# Patient Record
Sex: Female | Born: 1990 | Race: White | Hispanic: No | Marital: Single | Smoking: Never smoker
Health system: Southern US, Community
[De-identification: ages and names within clinical notes are randomized; demographics above are authoritative.]

## PROBLEM LIST (undated history)

## (undated) DIAGNOSIS — Z1509 Genetic susceptibility to other malignant neoplasm: Secondary | ICD-10-CM

## (undated) DIAGNOSIS — Z1501 Genetic susceptibility to malignant neoplasm of breast: Secondary | ICD-10-CM

## (undated) DIAGNOSIS — Z8481 Family history of carrier of genetic disease: Secondary | ICD-10-CM

## (undated) HISTORY — DX: Genetic susceptibility to other malignant neoplasm: Z15.09

## (undated) HISTORY — DX: Family history of carrier of genetic disease: Z84.81

## (undated) HISTORY — DX: Genetic susceptibility to malignant neoplasm of breast: Z15.01

## (undated) HISTORY — PX: OTHER SURGICAL HISTORY: SHX169

---
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
## 2012-07-21 ENCOUNTER — Ambulatory Visit: Payer: BC Managed Care – PPO | Admitting: Internal Medicine

## 2012-08-28 ENCOUNTER — Other Ambulatory Visit: Payer: BC Managed Care – PPO | Admitting: Lab

## 2012-08-28 ENCOUNTER — Ambulatory Visit (HOSPITAL_BASED_OUTPATIENT_CLINIC_OR_DEPARTMENT_OTHER): Payer: BC Managed Care – PPO | Admitting: Genetic Counselor

## 2012-08-28 DIAGNOSIS — Z803 Family history of malignant neoplasm of breast: Secondary | ICD-10-CM

## 2012-08-28 DIAGNOSIS — Z1501 Genetic susceptibility to malignant neoplasm of breast: Secondary | ICD-10-CM

## 2012-08-28 DIAGNOSIS — Z809 Family history of malignant neoplasm, unspecified: Secondary | ICD-10-CM

## 2012-08-28 DIAGNOSIS — IMO0002 Reserved for concepts with insufficient information to code with codable children: Secondary | ICD-10-CM

## 2012-08-31 NOTE — Progress Notes (Signed)
Dr. Fabian Sharp requested a cancer genetics consultation for Desiree Mcclure, a 22 y.o. female, due to a family history of cancer and a recently identified BRCA1 mutation found in her father, Syona Wroblewski [facility] ZO#[id number]).  Desiree Mcclure presents to clinic today to discuss the possibility of a hereditary predisposition to cancer, genetic testing, and to further clarify her future cancer risks, as well as potential cancer risk for family members. She was accompanied to clinic by her mother and older sister, Noelia Lenart [facility] MR# [id number]).   HISTORY OF PRESENT ILLNESS: Desiree Mcclure has no personal history of cancer and reports she is in good general health.  HORMONAL RISK FACTORS: Menarche was at age 77 First live birth at age NA  OCP use: approximately 0 years Ovaries intact: yes Hysterectomy: no Menopausal status: premenopausal HRT use: approximately 0 years Colonoscopy: no; not examined Mammogram: no; not examined  History   Social History   Marital Status: Single    Spouse Name: N/A    Number of Children: N/A   Years of Education: N/A   Social History Main Topics   Smoking status: Never Smoker    Smokeless tobacco: Not on file   Alcohol Use: No   Drug Use: No   Sexually Active: Not on file   Other Topics Concern   Not on file      FAMILY HISTORY:  During the visit, a 4-generation pedigree was obtained. Significant diagnoses include the following:  Family History  Problem Relation Age of Onset   BRCA 1/2 Father     BRCA1, [phone]; [facility]   Breast cancer Paternal Aunt 56   Breast cancer Maternal Grandmother 72    bilateral; 46 and 45 yo   Colon cancer Maternal Grandfather 38   BRCA 1/2 Paternal Grandfather    Breast cancer Paternal Aunt 87   Ms. Nolton's ancestry is of Caucasian descent. There is Jewish ancestry on her mother's side of the family (maternal GM). There is no known consanguinity.  GENETIC COUNSELING ASSESSMENT: Desiree Mcclure is a 23 y.o. female  with a family history of breast cancer and a BRCA1 familial mutation on the paternal side of the family called, BRCA1, [id number]. This is a known deleterious mutation, and Desiree Mcclure is at 50% risk for this mutation and the associated cancer risks. We, therefore, discussed and recommended the following at today's visit.   DISCUSSION: We reviewed the characteristics, features and inheritance patterns of hereditary cancer syndromes. We also discussed genetic testing, including the appropriate family members to test, the process of testing, insurance coverage and turn-around-time for results. We recommended Desiree Mcclure pursue genetic testing for the BRCA1 mutation mentioned above.   PLAN: [name] wished to pursue genetic testing and the blood sample will be sent to [facility] for analysis of the BRCA1 mutation called, BRCA1, [id number].  We discussed the implications of a positive and negative genetic test result. Results should be available within approximately 3 weeks time, at which point they will be disclosed by telephone to Desiree Mcclure, as will any additional recommendations warranted by these results. Desiree Mcclure will receive a summary of her genetic counseling visit and a copy of her results once available. This information will also be available in Epic.     Based on [name] family history, we recommended her paternal relatives, both men and women pursue genetic testing for this mutation. We also recommended [name]'s maternal grandmother, who was diagnosed with bilateral breast cancer and is Jewish, pursue genetic testing.  If this maternal grandmother does not wish to pursue testing, we would then recommend Desiree Mcclure's mother pursue testing. Desiree Mcclure knows that if we can be of any assistance in coordinating genetic counseling and/or testing we are happy to do so.   We encouraged [name] to remain in contact with cancer genetics annually so that we can continuously update the family history and  inform her of any changes in cancer genetics and testing that may be of benefit for this family. Ms.  Weist questions were answered to her satisfaction today. Our contact information was provided should additional questions or concerns arise. Thank you for the referral and allowing [place] to share in the care of your patient.   The patient was seen for a total of 60 minutes, greater than 50% of which was spent face-to-face counseling.  This patient was discussed with Dr. Darnelle Catalan and he agrees with the above.   EPIC CC: Dr. Darnelle Catalan Dr. Welton Flakes   _______________________________________________________________________ For Office Staff:  Number of people involved in session: 4 Was an Intern/ student involved with case: no

## 2012-09-15 DIAGNOSIS — Z803 Family history of malignant neoplasm of breast: Secondary | ICD-10-CM | POA: Insufficient documentation

## 2012-09-21 ENCOUNTER — Encounter: Payer: Self-pay | Admitting: Genetic Counselor

## 2015-07-19 ENCOUNTER — Telehealth: Payer: Self-pay | Admitting: Internal Medicine

## 2015-07-19 NOTE — Telephone Encounter (Signed)
Yes but not this Friday

## 2015-07-19 NOTE — Telephone Encounter (Signed)
Pt last seen 2012. Pt mom is requesting re-est appt on Friday afternoon. Can I create 30 min slot?

## 2015-07-20 NOTE — Telephone Encounter (Signed)
Pt has been sch for 08-11-15

## 2015-08-11 ENCOUNTER — Ambulatory Visit (INDEPENDENT_AMBULATORY_CARE_PROVIDER_SITE_OTHER): Payer: BLUE CROSS/BLUE SHIELD | Admitting: Internal Medicine

## 2015-08-11 ENCOUNTER — Encounter: Payer: Self-pay | Admitting: Internal Medicine

## 2015-08-11 VITALS — BP 104/68 | HR 90 | Temp 98.4°F | Resp 12 | Ht 64.0 in | Wt 128.7 lb

## 2015-08-11 DIAGNOSIS — Z1501 Genetic susceptibility to malignant neoplasm of breast: Secondary | ICD-10-CM

## 2015-08-11 DIAGNOSIS — Z1509 Genetic susceptibility to other malignant neoplasm: Secondary | ICD-10-CM

## 2015-08-11 DIAGNOSIS — Z1502 Genetic susceptibility to malignant neoplasm of ovary: Secondary | ICD-10-CM

## 2015-08-11 DIAGNOSIS — Z0000 Encounter for general adult medical examination without abnormal findings: Secondary | ICD-10-CM

## 2015-08-11 LAB — LIPID PANEL
Cholesterol: 196 mg/dL (ref 0–200)
HDL: 91.2 mg/dL (ref >39.00)
LDL Cholesterol: 94 mg/dL (ref 0–99)
NonHDL: 105.17
Total CHOL/HDL Ratio: 2
Triglycerides: 57 mg/dL (ref 0.0–149.0)
VLDL: 11.4 mg/dL (ref 0.0–40.0)

## 2015-08-11 LAB — CBC WITH DIFFERENTIAL/PLATELET
Basophils Absolute: 0 10*3/uL (ref 0.0–0.1)
Basophils Relative: 0.3 % (ref 0.0–3.0)
Eosinophils Absolute: 0 10*3/uL (ref 0.0–0.7)
Eosinophils Relative: 0.2 % (ref 0.0–5.0)
HCT: 42.7 % (ref 36.0–46.0)
Hemoglobin: 14.4 g/dL (ref 12.0–15.0)
Lymphocytes Relative: 19.5 % (ref 12.0–46.0)
Lymphs Abs: 2.7 10*3/uL (ref 0.7–4.0)
MCHC: 33.9 g/dL (ref 30.0–36.0)
MCV: 87 fl (ref 78.0–100.0)
Monocytes Absolute: 1 10*3/uL (ref 0.1–1.0)
Monocytes Relative: 7.1 % (ref 3.0–12.0)
Neutro Abs: 9.8 10*3/uL — ABNORMAL HIGH (ref 1.4–7.7)
Neutrophils Relative %: 72.4 % (ref 43.0–77.0)
Platelets: 356 10*3/uL (ref 150.0–400.0)
RBC: 4.9 Mil/uL (ref 3.87–5.11)
RDW: 13.1 % (ref 11.5–15.5)
WBC: 13.5 10*3/uL — ABNORMAL HIGH (ref 4.0–10.5)

## 2015-08-11 LAB — BASIC METABOLIC PANEL
BUN: 12 mg/dL (ref 6–23)
CO2: 28 mEq/L (ref 19–32)
Calcium: 9.5 mg/dL (ref 8.4–10.5)
Chloride: 103 mEq/L (ref 96–112)
Creatinine, Ser: 0.68 mg/dL (ref 0.40–1.20)
GFR: 113.02 mL/min (ref >60.00)
Glucose, Bld: 72 mg/dL (ref 70–99)
Potassium: 3.9 mEq/L (ref 3.5–5.1)
Sodium: 140 mEq/L (ref 135–145)

## 2015-08-11 LAB — HEPATIC FUNCTION PANEL
ALT: 8 U/L (ref 0–35)
AST: 13 U/L (ref 0–37)
Albumin: 4.7 g/dL (ref 3.5–5.2)
Alkaline Phosphatase: 18 U/L — ABNORMAL LOW (ref 39–117)
Bilirubin, Direct: 0.1 mg/dL (ref 0.0–0.3)
Total Bilirubin: 0.5 mg/dL (ref 0.2–1.2)
Total Protein: 7.3 g/dL (ref 6.0–8.3)

## 2015-08-11 LAB — TSH: TSH: 0.92 u[IU]/mL (ref 0.35–4.50)

## 2015-08-11 NOTE — Patient Instructions (Addendum)
Get [place] a copy  Of the genetic evaluation. To help with more information. Also sign release  Incase  We can get records from previous testing Will notify you  of labs when available.  To include cholesterol and thyroid. And blood sugars   Will arrange  Referral to gyne   You have low to little risk for  cervical cancer by hx . However advise   Gyne fu anyway because of the genetic risk hx .   Let us know if  Getting inability to exercise  For physical reasons   Get more sleep  8 hours better .     Health Maintenance, Female Adopting a healthy lifestyle and getting preventive care can go a long way to promote health and wellness. Talk with your health care provider about what schedule of regular examinations is right for you. This is a good chance for you to check in with your provider about disease prevention and staying healthy. In between checkups, there are plenty of things you can do on your own. Experts have done a lot of research about which lifestyle changes and preventive measures are most likely to keep you healthy. Ask your health care provider for more information. WEIGHT AND DIET  Eat a healthy diet  Be sure to include plenty of vegetables, fruits, low-fat dairy products, and lean protein.  Do not eat a lot of foods high in solid fats, added sugars, or salt.  Get regular exercise. This is one of the most important things you can do for your health.  Most adults should exercise for at least 150 minutes each week. The exercise should increase your heart rate and make you sweat (moderate-intensity exercise).  Most adults should also do strengthening exercises at least twice a week. This is in addition to the moderate-intensity exercise.  Maintain a healthy weight  Body mass index (BMI) is a measurement that can be used to identify possible weight problems. It estimates body fat based on height and weight. Your health care provider can help determine your BMI and help you achieve  or maintain a healthy weight.  For females 19 years of age and older:   A BMI below 18.5 is considered underweight.  A BMI of 18.5 to 24.9 is normal.  A BMI of 25 to 29.9 is considered overweight.  A BMI of 30 and above is considered obese.  Watch levels of cholesterol and blood lipids  You should start having your blood tested for lipids and cholesterol at 24 years of age, then have this test every 5 years.  You may need to have your cholesterol levels checked more often if:  Your lipid or cholesterol levels are high.  You are older than 24 years of age.  You are at high risk for heart disease.  CANCER SCREENING   Lung Cancer  Lung cancer screening is recommended for adults 9-37 years old who are at high risk for lung cancer because of a history of smoking.  A yearly low-dose CT scan of the lungs is recommended for people who:  Currently smoke.  Have quit within the past 15 years.  Have at least a 30-pack-year history of smoking. A pack year is smoking an average of one pack of cigarettes a day for 1 year.  Yearly screening should continue until it has been 15 years since you quit.  Yearly screening should stop if you develop a health problem that would prevent you from having lung cancer treatment.  Breast Cancer  Practice breast self-awareness. This means understanding how your breasts normally appear and feel.  It also means doing regular breast self-exams. Let your health care provider know about any changes, no matter how small.  If you are in your 20s or 30s, you should have a clinical breast exam (CBE) by a health care provider every 1-3 years as part of a regular health exam.  If you are 20 or older, have a CBE every year. Also consider having a breast X-ray (mammogram) every year.  If you have a family history of breast cancer, talk to your health care provider about genetic screening.  If you are at high risk for breast cancer, talk to your health  care provider about having an MRI and a mammogram every year.  Breast cancer gene (BRCA) assessment is recommended for women who have family members with BRCA-related cancers. BRCA-related cancers include:  Breast.  Ovarian.  Tubal.  Peritoneal cancers.  Results of the assessment will determine the need for genetic counseling and BRCA1 and BRCA2 testing. Cervical Cancer Your health care provider may recommend that you be screened regularly for cancer of the pelvic organs (ovaries, uterus, and vagina). This screening involves a pelvic examination, including checking for microscopic changes to the surface of your cervix (Pap test). You may be encouraged to have this screening done every 3 years, beginning at age 67.  For women ages 83-65, health care providers may recommend pelvic exams and Pap testing every 3 years, or they may recommend the Pap and pelvic exam, combined with testing for human papilloma virus (HPV), every 5 years. Some types of HPV increase your risk of cervical cancer. Testing for HPV may also be done on women of any age with unclear Pap test results.  Other health care providers may not recommend any screening for nonpregnant women who are considered low risk for pelvic cancer and who do not have symptoms. Ask your health care provider if a screening pelvic exam is right for you.  If you have had past treatment for cervical cancer or a condition that could lead to cancer, you need Pap tests and screening for cancer for at least 20 years after your treatment. If Pap tests have been discontinued, your risk factors (such as having a new sexual partner) need to be reassessed to determine if screening should resume. Some women have medical problems that increase the chance of getting cervical cancer. In these cases, your health care provider may recommend more frequent screening and Pap tests. Colorectal Cancer  This type of cancer can be detected and often prevented.  Routine  colorectal cancer screening usually begins at 24 years of age and continues through 24 years of age.  Your health care provider may recommend screening at an earlier age if you have risk factors for colon cancer.  Your health care provider may also recommend using home test kits to check for hidden blood in the stool.  A small camera at the end of a tube can be used to examine your colon directly (sigmoidoscopy or colonoscopy). This is done to check for the earliest forms of colorectal cancer.  Routine screening usually begins at age 71.  Direct examination of the colon should be repeated every 5-10 years through 24 years of age. However, you may need to be screened more often if early forms of precancerous polyps or small growths are found. Skin Cancer  Check your skin from head to toe regularly.  Tell your health care provider about any  new moles or changes in moles, especially if there is a change in a mole's shape or color.  Also tell your health care provider if you have a mole that is larger than the size of a pencil eraser.  Always use sunscreen. Apply sunscreen liberally and repeatedly throughout the day.  Protect yourself by wearing long sleeves, pants, a wide-brimmed hat, and sunglasses whenever you are outside. HEART DISEASE, DIABETES, AND HIGH BLOOD PRESSURE   High blood pressure causes heart disease and increases the risk of stroke. High blood pressure is more likely to develop in:  People who have blood pressure in the high end of the normal range (130-139/85-89 mm Hg).  People who are overweight or obese.  People who are African American.  If you are 43-31 years of age, have your blood pressure checked every 3-5 years. If you are 38 years of age or older, have your blood pressure checked every year. You should have your blood pressure measured twice--once when you are at a hospital or clinic, and once when you are not at a hospital or clinic. Record the average of the  two measurements. To check your blood pressure when you are not at a hospital or clinic, you can use:  An automated blood pressure machine at a pharmacy.  A home blood pressure monitor.  If you are between 81 years and 9 years old, ask your health care provider if you should take aspirin to prevent strokes.  Have regular diabetes screenings. This involves taking a blood sample to check your fasting blood sugar level.  If you are at a normal weight and have a low risk for diabetes, have this test once every three years after 24 years of age.  If you are overweight and have a high risk for diabetes, consider being tested at a younger age or more often. PREVENTING INFECTION  Hepatitis B  If you have a higher risk for hepatitis B, you should be screened for this virus. You are considered at high risk for hepatitis B if:  You were born in a country where hepatitis B is common. Ask your health care provider which countries are considered high risk.  Your parents were born in a high-risk country, and you have not been immunized against hepatitis B (hepatitis B vaccine).  You have HIV or AIDS.  You use needles to inject street drugs.  You live with someone who has hepatitis B.  You have had sex with someone who has hepatitis B.  You get hemodialysis treatment.  You take certain medicines for conditions, including cancer, organ transplantation, and autoimmune conditions. Hepatitis C  Blood testing is recommended for:  Everyone born from [birth year] through 1964.  Anyone with known risk factors for hepatitis C. Sexually transmitted infections (STIs)  You should be screened for sexually transmitted infections (STIs) including gonorrhea and chlamydia if:  You are sexually active and are younger than 24 years of age.  You are older than 23 years of age and your health care provider tells you that you are at risk for this type of infection.  Your sexual activity has changed since you were  last screened and you are at an increased risk for chlamydia or gonorrhea. Ask your health care provider if you are at risk.  If you do not have HIV, but are at risk, it may be recommended that you take a prescription medicine daily to prevent HIV infection. This is called pre-exposure prophylaxis (PrEP). You are considered at risk  if:  You are sexually active and do not regularly use condoms or know the HIV status of your partner(s).  You take drugs by injection.  You are sexually active with a partner who has HIV. Talk with your health care provider about whether you are at high risk of being infected with HIV. If you choose to begin PrEP, you should first be tested for HIV. You should then be tested every 3 months for as long as you are taking PrEP.  PREGNANCY   If you are premenopausal and you may become pregnant, ask your health care provider about preconception counseling.  If you may become pregnant, take 400 to 800 micrograms (mcg) of folic acid every day.  If you want to prevent pregnancy, talk to your health care provider about birth control (contraception). OSTEOPOROSIS AND MENOPAUSE   Osteoporosis is a disease in which the bones lose minerals and strength with aging. This can result in serious bone fractures. Your risk for osteoporosis can be identified using a bone density scan.  If you are 63 years of age or older, or if you are at risk for osteoporosis and fractures, ask your health care provider if you should be screened.  Ask your health care provider whether you should take a calcium or vitamin D supplement to lower your risk for osteoporosis.  Menopause may have certain physical symptoms and risks.  Hormone replacement therapy may reduce some of these symptoms and risks. Talk to your health care provider about whether hormone replacement therapy is right for you.  HOME CARE INSTRUCTIONS   Schedule regular health, dental, and eye exams.  Stay current with your  immunizations.   Do not use any tobacco products including cigarettes, chewing tobacco, or electronic cigarettes.  If you are pregnant, do not drink alcohol.  If you are breastfeeding, limit how much and how often you drink alcohol.  Limit alcohol intake to no more than 1 drink per day for nonpregnant women. One drink equals 12 ounces of beer, 5 ounces of wine, or 1 ounces of hard liquor.  Do not use street drugs.  Do not share needles.  Ask your health care provider for help if you need support or information about quitting drugs.  Tell your health care provider if you often feel depressed.  Tell your health care provider if you have ever been abused or do not feel safe at home.   This information is not intended to replace advice given to you by your health care provider. Make sure you discuss any questions you have with your health care provider.   Document Released: 11/12/2010 Document Revised: 05/20/2014 Document Reviewed: 03/31/2013 Elsevier Interactive Patient Education [employer].

## 2015-08-11 NOTE — Progress Notes (Signed)
Chief Complaint  Patient presents with  . Establish Care    wellness exam  advice on  brca gene abnormality     HPI: Patient  Desiree Mcclure  25 y.o. comes in today for reestablish  New patient visit  Last seen in 2012  Need check up and   Advice  Breast cancer gene   Done 3-4 years ago . 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  Periods reg   Health Maintenance  Topic Date Due  . HIV Screening  08/23/2006  . TETANUS/TDAP  08/23/2010  . PAP SMEAR  08/22/2012  . INFLUENZA VACCINE  12/12/2015   Health Maintenance Review LIFESTYLE:  Exercise:  n Tobacco/ETS:n Alcohol: per day social Sugar beverages: Sleep:  Ok  Drug use: no lmp 2 weeks ago  Never had a pap  No sa    ROS:  Had heart beat strong when laying on stomach using computer  Had mintess of episode with chest pressure at work sitting nl high bp and   noass [place]  Drinks lots of caffiene GEN/ HEENT: No fever, significant weight changes sweats headaches vision problems hearing changes, CV/ PULM; No chest pain shortness of breath cough, syncope,edema  change in exercise tolerance. GI /GU: No adominal pain, vomiting, change in bowel habits. No blood in the stool. No significant GU symptoms. SKIN/HEME: ,no acute skin rashes suspicious lesions or bleeding. No lymphadenopathy, nodules, masses.  NEURO/ PSYCH:  No neurologic signs such as weakness numbness. No depression anxiety. IMM/ Allergy: No unusual infections.  Allergy .   REST of 12 system review negative except as per HPI   Past Medical History  Diagnosis Date  . Constipation   . 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 Family history of BRCA1 gene positive     father     Past Surgical History  Procedure Laterality Date  . Mole removal of toe      Family History  Problem Relation Age of Onset  . BRCA 1/2 Father     BRCA1, [id number]; [facility]  . Breast cancer Paternal Aunt 40   . Breast cancer Maternal Grandmother 68    bilateral; 78 and 65 yo  . Colon cancer Maternal Grandfather 33  . BRCA 1/2 Paternal Grandfather   . Breast cancer Paternal Aunt 31    Social History   Social History  . Marital Status: Single    Spouse Name: N/A  . Number of Children: N/A  . Years of Education: N/A   Social History Main Topics  . Smoking status: Never Smoker   . Smokeless tobacco: None  . Alcohol Use: No  . Drug Use: No  . Sexual Activity: Not Asked   Other Topics Concern  . 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   No facility-administered medications prior to visit.     EXAM:  BP 104/68 mmHg  Pulse 90  Temp(Src) 98.4 F (36.9 C) (Oral)  Resp  12  Ht '5\' 4"'$  (1.626 m)  Wt 128 lb 11.2 oz (58.378 kg)  BMI 22.08 kg/m2  SpO2 98%  Body mass index is 22.08 kg/(m^2).  Physical Exam: Vital signs reviewed WUX:LKGM is a well-developed well-nourished alert cooperative    who appearsr stated age in no acute distress.  HEENT: normocephalic atraumatic , Eyes: PERRL EOM's full, conjunctiva clear, Nares: paten,t no deformity discharge or tenderness., Ears: no deformity EAC's clear TMs with normal landmarks. Mouth: clear OP, no lesions, edema.  Moist mucous membranes. Dentition in adequate repair. NECK: supple without masses, thyromegaly or bruits. CHEST/PULM:  Clear to auscultation and percussion breath sounds equal no wheeze , rales or rhonchi. No chest wall deformities or tenderness. CV: PMI is nondisplaced, S1 S2 no gallops, murmurs, rubs. Peripheral pulses are full without delay.No JVD .  ABDOMEN: Bowel sounds normal nontender  No guard or rebound, no hepato splenomegal no CVA tenderness.  No  hernia. Extremtities:  No clubbing cyanosis or edema, no acute joint swelling or redness no focal atrophy NEURO:  Oriented x3, cranial nerves 3-12 appear to be intact, no obvious focal weakness,gait within normal limits no abnormal reflexes or asymmetrical SKIN: No acute rashes normal turgor, color, no bruising or petechiae.  Mod num moles no  Alarming ones  PSYCH: Oriented, good eye contact, no obvious depression anxiety, cognition and judgment appear normal. LN: no cervical axillary inguinal adenopathy Gyne  ext gu  Nl  attemtp to do speculum exam with  And too uncomofrtable  At hymenal ring  So  Not completed   Lab Results  Component Value Date   WBC 13.5* 08/11/2015   HGB 14.4 08/11/2015   HCT 42.7 08/11/2015   PLT 356.0 08/11/2015   GLUCOSE 72 08/11/2015   CHOL 196 08/11/2015   TRIG 57.0 08/11/2015   HDL 91.20 08/11/2015   LDLCALC 94 08/11/2015   ALT 8 08/11/2015   AST 13 08/11/2015   NA 140 08/11/2015   K 3.9 08/11/2015   CL 103 08/11/2015   CREATININE 0.68 08/11/2015   BUN 12 08/11/2015   CO2 28 08/11/2015   TSH 0.92 08/11/2015    ASSESSMENT AND PLAN:  Discussed the following assessment and plan:  Visit for preventive health examination - Plan: Basic metabolic panel, CBC with Differential/Platelet, Hepatic function panel, Lipid panel, TSH  BRCA gene positive Counseled regarding healthy nutrition, exercise, sleep, injury prevention, calcium vit d and healthy weight . Normal exam and healthy   Will plan on gyne to do gyne exam pap as needed but  She is very low risk for cervical disease by hx . So not compelling .   Get [place] information  ( we done have her  Genetic testing  [place])  Patient Care Team: Burnis Medin, MD as PCP - General Patient Instructions  Get [place] a copy  Of the genetic evaluation. To help with more information. Also sign release  Incase  We can get records from previous testing Will notify you  of labs when available.  To include  cholesterol and thyroid. And blood sugars   Will arrange  Referral to gyne   You have low to little risk for  cervical cancer by hx . However advise   Gyne fu anyway because of the genetic risk hx .   Let us know if  Getting inability to exercise  For physical reasons   Get more sleep  8 hours better .     Health Maintenance, Female Adopting  a healthy lifestyle and getting preventive care can go a long way to promote health and wellness. Talk with your health care provider about what schedule of regular examinations is right for you. This is a good chance for you to check in with your provider about disease prevention and staying healthy. In between checkups, there are plenty of things you can do on your own. Experts have done a lot of research about which lifestyle changes and preventive measures are most likely to keep you healthy. Ask your health care provider for more information. WEIGHT AND DIET  Eat a healthy diet  Be sure to include plenty of vegetables, fruits, low-fat dairy products, and lean protein.  Do not eat a lot of foods high in solid fats, added sugars, or salt.  Get regular exercise. This is one of the most important things you can do for your health.  Most adults should exercise for at least 150 minutes each week. The exercise should increase your heart rate and make you sweat (moderate-intensity exercise).  Most adults should also do strengthening exercises at least twice a week. This is in addition to the moderate-intensity exercise.  Maintain a healthy weight  Body mass index (BMI) is a measurement that can be used to identify possible weight problems. It estimates body fat based on height and weight. Your health care provider can help determine your BMI and help you achieve or maintain a healthy weight.  For females 56 years of age and older:   A BMI below 18.5 is considered underweight.  A BMI of 18.5 to 24.9 is normal.  A BMI of 25 to 29.9 is considered  overweight.  A BMI of 30 and above is considered obese.  Watch levels of cholesterol and blood lipids  You should start having your blood tested for lipids and cholesterol at 24 years of age, then have this test every 5 years.  You may need to have your cholesterol levels checked more often if:  Your lipid or cholesterol levels are high.  You are older than 24 years of age.  You are at high risk for heart disease.  CANCER SCREENING   Lung Cancer  Lung cancer screening is recommended for adults 59-91 years old who are at high risk for lung cancer because of a history of smoking.  A yearly low-dose CT scan of the lungs is recommended for people who:  Currently smoke.  Have quit within the past 15 years.  Have at least a 30-pack-year history of smoking. A pack year is smoking an average of one pack of cigarettes a day for 1 year.  Yearly screening should continue until it has been 15 years since you quit.  Yearly screening should stop if you develop a health problem that would prevent you from having lung cancer treatment.  Breast Cancer  Practice breast self-awareness. This means understanding how your breasts normally appear and feel.  It also means doing regular breast self-exams. Let your health care provider know about any changes, no matter how small.  If you are in your 20s or 30s, you should have a clinical breast exam (CBE) by a health care provider every 1-3 years as part of a regular health exam.  If you are 50 or older, have a CBE every year. Also consider having a breast X-ray (mammogram) every year.  If you have a family history of breast cancer, talk to your health care provider about genetic screening.  If you are at high risk  for breast cancer, talk to your health care provider about having an MRI and a mammogram every year.  Breast cancer gene (BRCA) assessment is recommended for women who have family members with BRCA-related cancers. BRCA-related  cancers include:  Breast.  Ovarian.  Tubal.  Peritoneal cancers.  Results of the assessment will determine the need for genetic counseling and BRCA1 and BRCA2 testing. Cervical Cancer Your health care provider may recommend that you be screened regularly for cancer of the pelvic organs (ovaries, uterus, and vagina). This screening involves a pelvic examination, including checking for microscopic changes to the surface of your cervix (Pap test). You may be encouraged to have this screening done every 3 years, beginning at age 36.  For women ages 92-65, health care providers may recommend pelvic exams and Pap testing every 3 years, or they may recommend the Pap and pelvic exam, combined with testing for human papilloma virus (HPV), every 5 years. Some types of HPV increase your risk of cervical cancer. Testing for HPV may also be done on women of any age with unclear Pap test results.  Other health care providers may not recommend any screening for nonpregnant women who are considered low risk for pelvic cancer and who do not have symptoms. Ask your health care provider if a screening pelvic exam is right for you.  If you have had past treatment for cervical cancer or a condition that could lead to cancer, you need Pap tests and screening for cancer for at least 20 years after your treatment. If Pap tests have been discontinued, your risk factors (such as having a new sexual partner) need to be reassessed to determine if screening should resume. Some women have medical problems that increase the chance of getting cervical cancer. In these cases, your health care provider may recommend more frequent screening and Pap tests. Colorectal Cancer  This type of cancer can be detected and often prevented.  Routine colorectal cancer screening usually begins at 24 years of age and continues through 24 years of age.  Your health care provider may recommend screening at an earlier age if you have risk  factors for colon cancer.  Your health care provider may also recommend using home test kits to check for hidden blood in the stool.  A small camera at the end of a tube can be used to examine your colon directly (sigmoidoscopy or colonoscopy). This is done to check for the earliest forms of colorectal cancer.  Routine screening usually begins at age 74.  Direct examination of the colon should be repeated every 5-10 years through 24 years of age. However, you may need to be screened more often if early forms of precancerous polyps or small growths are found. Skin Cancer  Check your skin from head to toe regularly.  Tell your health care provider about any new moles or changes in moles, especially if there is a change in a mole's shape or color.  Also tell your health care provider if you have a mole that is larger than the size of a pencil eraser.  Always use sunscreen. Apply sunscreen liberally and repeatedly throughout the day.  Protect yourself by wearing long sleeves, pants, a wide-brimmed hat, and sunglasses whenever you are outside. HEART DISEASE, DIABETES, AND HIGH BLOOD PRESSURE   High blood pressure causes heart disease and increases the risk of stroke. High blood pressure is more likely to develop in:  People who have blood pressure in the high end of the normal range (  130-139/85-89 mm Hg).  People who are overweight or obese.  People who are African American.  If you are 62-59 years of age, have your blood pressure checked every 3-5 years. If you are 48 years of age or older, have your blood pressure checked every year. You should have your blood pressure measured twice--once when you are at a hospital or clinic, and once when you are not at a hospital or clinic. Record the average of the two measurements. To check your blood pressure when you are not at a hospital or clinic, you can use:  An automated blood pressure machine at a pharmacy.  A home blood pressure  monitor.  If you are between 9 years and 51 years old, ask your health care provider if you should take aspirin to prevent strokes.  Have regular diabetes screenings. This involves taking a blood sample to check your fasting blood sugar level.  If you are at a normal weight and have a low risk for diabetes, have this test once every three years after 24 years of age.  If you are overweight and have a high risk for diabetes, consider being tested at a younger age or more often. PREVENTING INFECTION  Hepatitis B  If you have a higher risk for hepatitis B, you should be screened for this virus. You are considered at high risk for hepatitis B if:  You were born in a country where hepatitis B is common. Ask your health care provider which countries are considered high risk.  Your parents were born in a high-risk country, and you have not been immunized against hepatitis B (hepatitis B vaccine).  You have HIV or AIDS.  You use needles to inject street drugs.  You live with someone who has hepatitis B.  You have had sex with someone who has hepatitis B.  You get hemodialysis treatment.  You take certain medicines for conditions, including cancer, organ transplantation, and autoimmune conditions. Hepatitis C  Blood testing is recommended for:  Everyone born from [birth year] through 1964.  Anyone with known risk factors for hepatitis C. Sexually transmitted infections (STIs)  You should be screened for sexually transmitted infections (STIs) including gonorrhea and chlamydia if:  You are sexually active and are younger than 24 years of age.  You are older than 24 years of age and your health care provider tells you that you are at risk for this type of infection.  Your sexual activity has changed since you were last screened and you are at an increased risk for chlamydia or gonorrhea. Ask your health care provider if you are at risk.  If you do not have HIV, but are at risk, it may be  recommended that you take a prescription medicine daily to prevent HIV infection. This is called pre-exposure prophylaxis (PrEP). You are considered at risk if:  You are sexually active and do not regularly use condoms or know the HIV status of your partner(s).  You take drugs by injection.  You are sexually active with a partner who has HIV. Talk with your health care provider about whether you are at high risk of being infected with HIV. If you choose to begin PrEP, you should first be tested for HIV. You should then be tested every 3 months for as long as you are taking PrEP.  PREGNANCY   If you are premenopausal and you may become pregnant, ask your health care provider about preconception counseling.  If you may become pregnant, take 400  to 800 micrograms (mcg) of folic acid every day.  If you want to prevent pregnancy, talk to your health care provider about birth control (contraception). OSTEOPOROSIS AND MENOPAUSE   Osteoporosis is a disease in which the bones lose minerals and strength with aging. This can result in serious bone fractures. Your risk for osteoporosis can be identified using a bone density scan.  If you are 46 years of age or older, or if you are at risk for osteoporosis and fractures, ask your health care provider if you should be screened.  Ask your health care provider whether you should take a calcium or vitamin D supplement to lower your risk for osteoporosis.  Menopause may have certain physical symptoms and risks.  Hormone replacement therapy may reduce some of these symptoms and risks. Talk to your health care provider about whether hormone replacement therapy is right for you.  HOME CARE INSTRUCTIONS   Schedule regular health, dental, and eye exams.  Stay current with your immunizations.   Do not use any tobacco products including cigarettes, chewing tobacco, or electronic cigarettes.  If you are pregnant, do not drink alcohol.  If you are  breastfeeding, limit how much and how often you drink alcohol.  Limit alcohol intake to no more than 1 drink per day for nonpregnant women. One drink equals 12 ounces of beer, 5 ounces of wine, or 1 ounces of hard liquor.  Do not use street drugs.  Do not share needles.  Ask your health care provider for help if you need support or information about quitting drugs.  Tell your health care provider if you often feel depressed.  Tell your health care provider if you have ever been abused or do not feel safe at home.   This information is not intended to replace advice given to you by your health care provider. Make sure you discuss any questions you have with your health care provider.   Document Released: 11/12/2010 Document Revised: 05/20/2014 Document Reviewed: 03/31/2013 Elsevier Interactive Patient Education 2016 [place] K. Panosh M.D.

## 2015-08-12 ENCOUNTER — Encounter: Payer: Self-pay | Admitting: Internal Medicine

## 2015-08-12 DIAGNOSIS — Z1501 Genetic susceptibility to malignant neoplasm of breast: Secondary | ICD-10-CM | POA: Insufficient documentation

## 2015-08-12 DIAGNOSIS — Z1509 Genetic susceptibility to other malignant neoplasm: Secondary | ICD-10-CM

## 2015-08-14 ENCOUNTER — Telehealth: Payer: Self-pay | Admitting: Obstetrics and Gynecology

## 2015-08-14 NOTE — Telephone Encounter (Signed)
Called and left a message for patient to call back to schedule a new patient doctor referral. °

## 2015-08-21 ENCOUNTER — Encounter: Payer: Self-pay | Admitting: Obstetrics and Gynecology

## 2015-08-21 ENCOUNTER — Ambulatory Visit: Payer: BLUE CROSS/BLUE SHIELD | Admitting: Obstetrics and Gynecology

## 2015-08-21 ENCOUNTER — Ambulatory Visit (INDEPENDENT_AMBULATORY_CARE_PROVIDER_SITE_OTHER): Payer: BLUE CROSS/BLUE SHIELD | Admitting: Obstetrics and Gynecology

## 2015-08-21 VITALS — BP 106/70 | HR 70 | Resp 16 | Ht 64.5 in | Wt 131.0 lb

## 2015-08-21 DIAGNOSIS — Z01419 Encounter for gynecological examination (general) (routine) without abnormal findings: Secondary | ICD-10-CM | POA: Diagnosis not present

## 2015-08-21 NOTE — Progress Notes (Addendum)
Desiree Mcclure Kitchen 25 y.o. G0P0000 SingleCaucasianF here for annual exam. Never sexually active. 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 OTC meds help her cramps.   Patient's last menstrual period was 07/28/2015.          Sexually active: No.  The current method of family planning is abstinence.    Exercising: No.  exercise Smoker:  no  Health Maintenance: Pap:  none History of abnormal Pap:  no MMG:  none Colonoscopy:  none BMD:   none TDaP:  2012 Gardasil: unsure Self breast exam: done occ   reports that she has never smoked. She does not have any smokeless tobacco history on file. She reports that she drinks about 3.6 oz of alcohol per week. She reports that she does not use illicit drugs.She does process control at a plant. Lives alone.   Past Medical History  Diagnosis Date  . Constipation   . 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 Family history of BRCA1 gene positive     father     Past Surgical History  Procedure Laterality Date  . Mole removal of toe      No current outpatient prescriptions on file.   No current facility-administered medications for this visit.    Family History  Problem Relation Age of Onset  . BRCA 1/2 Father     BRCA1, [id number]; [facility]  . Skin cancer Father   . Breast cancer Paternal Aunt 81  . Breast cancer Maternal Grandmother 29    bilateral; 60 and 3 yo  . Colon cancer Maternal Grandfather 18  . BRCA 1/2 Paternal Grandfather     ?  . Diabetes Paternal Grandfather   . Breast cancer Paternal Aunt 16  . Uterine cancer Paternal Grandmother   . Stroke Paternal Grandmother   . Heart attack Paternal Grandmother     Review of Systems  Constitutional: Negative.   HENT: Negative.   Eyes: Negative.    Respiratory: Negative.   Cardiovascular: Negative.   Gastrointestinal: Negative.   Endocrine: Negative.   Genitourinary: Negative.   Musculoskeletal: Negative.   Skin: Negative.   Allergic/Immunologic: Negative.   Neurological: Negative.   Hematological: Negative.   Psychiatric/Behavioral: Negative.     Exam:   BP 106/70 mmHg  Pulse 70  Resp 16  Ht 5' 4.5" (1.638 m)  Wt 131 lb (59.421 kg)  BMI 22.15 kg/m2  LMP 07/28/2015  Weight change: _0 @ Height:   Height: 5' 4.5" (163.8 cm)  Ht Readings from Last 3 Encounters:  08/21/15 5' 4.5" (1.638 m)  08/11/15 _1  (1.626 m)  11/23/08 _2  (1.626 m) (47 %*, Z = -0.06)   * Growth percentiles are based on CDC 2-20 Years data.    General appearance: alert, cooperative and appears stated age Head: Normocephalic, without obvious abnormality, atraumatic Neck: no adenopathy, supple, symmetrical, trachea midline and thyroid normal to inspection and palpation Lungs: clear to auscultation bilaterally Breasts: normal appearance, no masses or tenderness Heart: regular rate and rhythm Abdomen: soft, non-tender; bowel sounds normal; no masses,  no organomegaly Extremities: extremities normal, atraumatic, no cyanosis or edema Skin: Skin color, texture, turgor normal. No rashes or lesions Lymph nodes: Cervical, supraclavicular, and axillary nodes normal. No abnormal inguinal nodes palpated Neurologic: Grossly normal  Pelvic: External genitalia:  no lesions              Urethra:  normal appearing urethra with no masses, tenderness or lesions              Bartholins and Skenes: normal                 Vagina: normal appearing vagina with normal color and discharge, no lesions. Able to insert a pediatric speculum, only able to open it 1-2 mm.               Cervix: not visualized. Patient unabe to tolerate opening of a pediatric speculum.                Bimanual Exam:  Uterus:  normal size, contour, position, consistency, mobility,  non-tender. Best appreciated on rectal exam. Vaginal exam with one finger was uncomfortable.               Adnexa: no mass, fullness, tenderness               Rectovaginal: Confirms               Anus:  normal sphincter tone, no lesions  Chaperone was present for exam.  A:  Well Woman with normal exam  BRCA 1 testing +, Father BRCA 1+. Youngest case of breast cancer in her family was at 79, no FH of ovarian cancer   P:   Discussed BRCA 1, risks for ovarian and breast cancer  Discussed the option of using OCP's, decreases the risk of ovarian cancer, could increase the risk of breast cancer. BRCA 1 is associated with a 39-44% risk of ovarian cancer  Discussed the risks of OCP's, no contraindications. She will call if she desires to start  Discussed breast self exam, yearly breast exams with provider, breast MRI starting at 25, mammograms and ovarian cancer screening starting at 30  Discussed BSO at 35-40 after childbearing is complete (not sure she wants kids)  No STD testing needed  Unable to obtain a pap  Discussed the option of using ativan prior to future attempt at pap, she is having a hard time relaxing (would need a driver)    CC: Dr Regis Bill

## 2015-08-21 NOTE — Patient Instructions (Addendum)
Please check if you had your gardasil vaccinations  Oral Contraception Information Oral contraceptive pills (OCPs) are medicines taken to prevent pregnancy. OCPs work by preventing the ovaries from releasing eggs. The hormones in OCPs also cause the cervical mucus to thicken, preventing the sperm from entering the uterus. The hormones also cause the uterine lining to become thin, not allowing a fertilized egg to attach to the inside of the uterus. OCPs are highly effective when taken exactly as prescribed. However, OCPs do not prevent sexually transmitted diseases (STDs). Safe sex practices, such as using condoms along with the pill, can help prevent STDs.  Before taking the pill, you may have a physical exam and Pap test. Your health care provider may order blood tests. The health care provider will make sure you are a good candidate for oral contraception. Discuss with your health care provider the possible side effects of the OCP you may be prescribed. When starting an OCP, it can take 2 to 3 months for the body to adjust to the changes in hormone levels in your body.  TYPES OF ORAL CONTRACEPTION 1. The combination pill--This pill contains estrogen and progestin (synthetic progesterone) hormones. The combination pill comes in 21-day, 28-day, or 91-day packs. Some types of combination pills are meant to be taken continuously (365-day pills). With 21-day packs, you do not take pills for 7 days after the last pill. With 28-day packs, the pill is taken every day. The last 7 pills are without hormones. Certain types of pills have more than 21 hormone-containing pills. With 91-day packs, the first 84 pills contain both hormones, and the last 7 pills contain no hormones or contain estrogen only. 2. The minipill--This pill contains the progesterone hormone only. The pill is taken every day continuously. It is very important to take the pill at the same time each day. The minipill comes in packs of 28 pills. All 28  pills contain the hormone.  ADVANTAGES OF ORAL CONTRACEPTIVE PILLS  Decreases premenstrual symptoms.   Treats menstrual period cramps.   Regulates the menstrual cycle.   Decreases a heavy menstrual flow.   May treatacne, depending on the type of pill.   Treats abnormal uterine bleeding.   Treats polycystic ovarian syndrome.   Treats endometriosis.   Can be used as emergency contraception.  THINGS THAT CAN MAKE ORAL CONTRACEPTIVE PILLS LESS EFFECTIVE OCPs can be less effective if:   You forget to take the pill at the same time every day.   You have a stomach or intestinal disease that lessens the absorption of the pill.   You take OCPs with other medicines that make OCPs less effective, such as antibiotics, certain HIV medicines, and some seizure medicines.   You take expired OCPs.   You forget to restart the pill on day 7, when using the packs of 21 pills.  RISKS ASSOCIATED WITH ORAL CONTRACEPTIVE PILLS  Oral contraceptive pills can sometimes cause side effects, such as:  Headache.  Nausea.  Breast tenderness.  Irregular bleeding or spotting. Combination pills are also associated with a small increased risk of:  Blood clots.  Heart attack.  Stroke.   This information is not intended to replace advice given to you by your health care provider. Make sure you discuss any questions you have with your health care provider.   Document Released: 07/20/2002 Document Revised: 02/17/2013 Document Reviewed: 10/18/2012 Elsevier Interactive Patient Education 2016 Elsevier Inc.  Breast Self-Awareness Practicing breast self-awareness may pick up problems early, prevent significant medical  complications, and possibly save your life. By practicing breast self-awareness, you can become familiar with how your breasts look and feel and if your breasts are changing. This allows you to notice changes early. It can also offer you some reassurance that your breast  health is good. One way to learn what is normal for your breasts and whether your breasts are changing is to do a breast self-exam. If you find a lump or something that was not present in the past, it is best to contact your caregiver right away. Other findings that should be evaluated by your caregiver include nipple discharge, especially if it is bloody; skin changes or reddening; areas where the skin seems to be pulled in (retracted); or new lumps and bumps. Breast pain is seldom associated with cancer (malignancy), but should also be evaluated by a caregiver. HOW TO PERFORM A BREAST SELF-EXAM The best time to examine your breasts is 5-7 days after your menstrual period is over. During menstruation, the breasts are lumpier, and it may be more difficult to pick up changes. If you do not menstruate, have reached menopause, or had your uterus removed (hysterectomy), you should examine your breasts at regular intervals, such as monthly. If you are breastfeeding, examine your breasts after a feeding or after using a breast pump. Breast implants do not decrease the risk for lumps or tumors, so continue to perform breast self-exams as recommended. Talk to your caregiver about how to determine the difference between the implant and breast tissue. Also, talk about the amount of pressure you should use during the exam. Over time, you will become more familiar with the variations of your breasts and more comfortable with the exam. A breast self-exam requires you to remove all your clothes above the waist. 3. Look at your breasts and nipples. Stand in front of a mirror in a room with good lighting. With your hands on your hips, push your hands firmly downward. Look for a difference in shape, contour, and size from one breast to the other (asymmetry). Asymmetry includes puckers, dips, or bumps. Also, look for skin changes, such as reddened or scaly areas on the breasts. Look for nipple changes, such as discharge,  dimpling, repositioning, or redness. 4. Carefully feel your breasts. This is best done either in the shower or tub while using soapy water or when flat on your back. Place the arm (on the side of the breast you are examining) above your head. Use the pads (not the fingertips) of your three middle fingers on your opposite hand to feel your breasts. Start in the underarm area and use  inch (2 cm) overlapping circles to feel your breast. Use 3 different levels of pressure (light, medium, and firm pressure) at each circle before moving to the next circle. The light pressure is needed to feel the tissue closest to the skin. The medium pressure will help to feel breast tissue a little deeper, while the firm pressure is needed to feel the tissue close to the ribs. Continue the overlapping circles, moving downward over the breast until you feel your ribs below your breast. Then, move one finger-width towards the center of the body. Continue to use the  inch (2 cm) overlapping circles to feel your breast as you move slowly up toward the collar bone (clavicle) near the base of the neck. Continue the up and down exam using all 3 pressures until you reach the middle of the chest. Do this with each breast, carefully feeling for  lumps or changes. 5.  Keep a written record with breast changes or normal findings for each breast. By writing this information down, you do not need to depend only on memory for size, tenderness, or location. Write down where you are in your menstrual cycle, if you are still menstruating. Breast tissue can have some lumps or thick tissue. However, see your caregiver if you find anything that concerns you.  SEEK MEDICAL CARE IF:  You see a change in shape, contour, or size of your breasts or nipples.   You see skin changes, such as reddened or scaly areas on the breasts or nipples.   You have an unusual discharge from your nipples.   You feel a new lump or unusually thick areas.     This information is not intended to replace advice given to you by your health care provider. Make sure you discuss any questions you have with your health care provider.   Document Released: 04/29/2005 Document Revised: 04/15/2012 Document Reviewed: 08/14/2011 Elsevier Interactive Patient Education [employer]

## 2015-08-26 ENCOUNTER — Encounter: Payer: Self-pay | Admitting: *Deleted

## 2015-08-26 ENCOUNTER — Ambulatory Visit (INDEPENDENT_AMBULATORY_CARE_PROVIDER_SITE_OTHER): Payer: BLUE CROSS/BLUE SHIELD

## 2015-08-26 ENCOUNTER — Ambulatory Visit (INDEPENDENT_AMBULATORY_CARE_PROVIDER_SITE_OTHER): Payer: BLUE CROSS/BLUE SHIELD | Admitting: Physician Assistant

## 2015-08-26 VITALS — BP 128/80 | HR 90 | Temp 97.9°F | Resp 18 | Ht 64.5 in | Wt 131.1 lb

## 2015-08-26 DIAGNOSIS — M79672 Pain in left foot: Secondary | ICD-10-CM | POA: Diagnosis not present

## 2015-08-26 DIAGNOSIS — S92302A Fracture of unspecified metatarsal bone(s), left foot, initial encounter for closed fracture: Secondary | ICD-10-CM | POA: Diagnosis not present

## 2015-08-26 IMAGING — CR DG FOOT COMPLETE 3+V*L*
3 series · 3 of 3 positions shown · non-contrast
Comparison: None.

CLINICAL DATA: 25-year-old female with left lateral foot pain after
jumping injury

EXAM:
LEFT FOOT - COMPLETE 3+ VIEW

[AP]
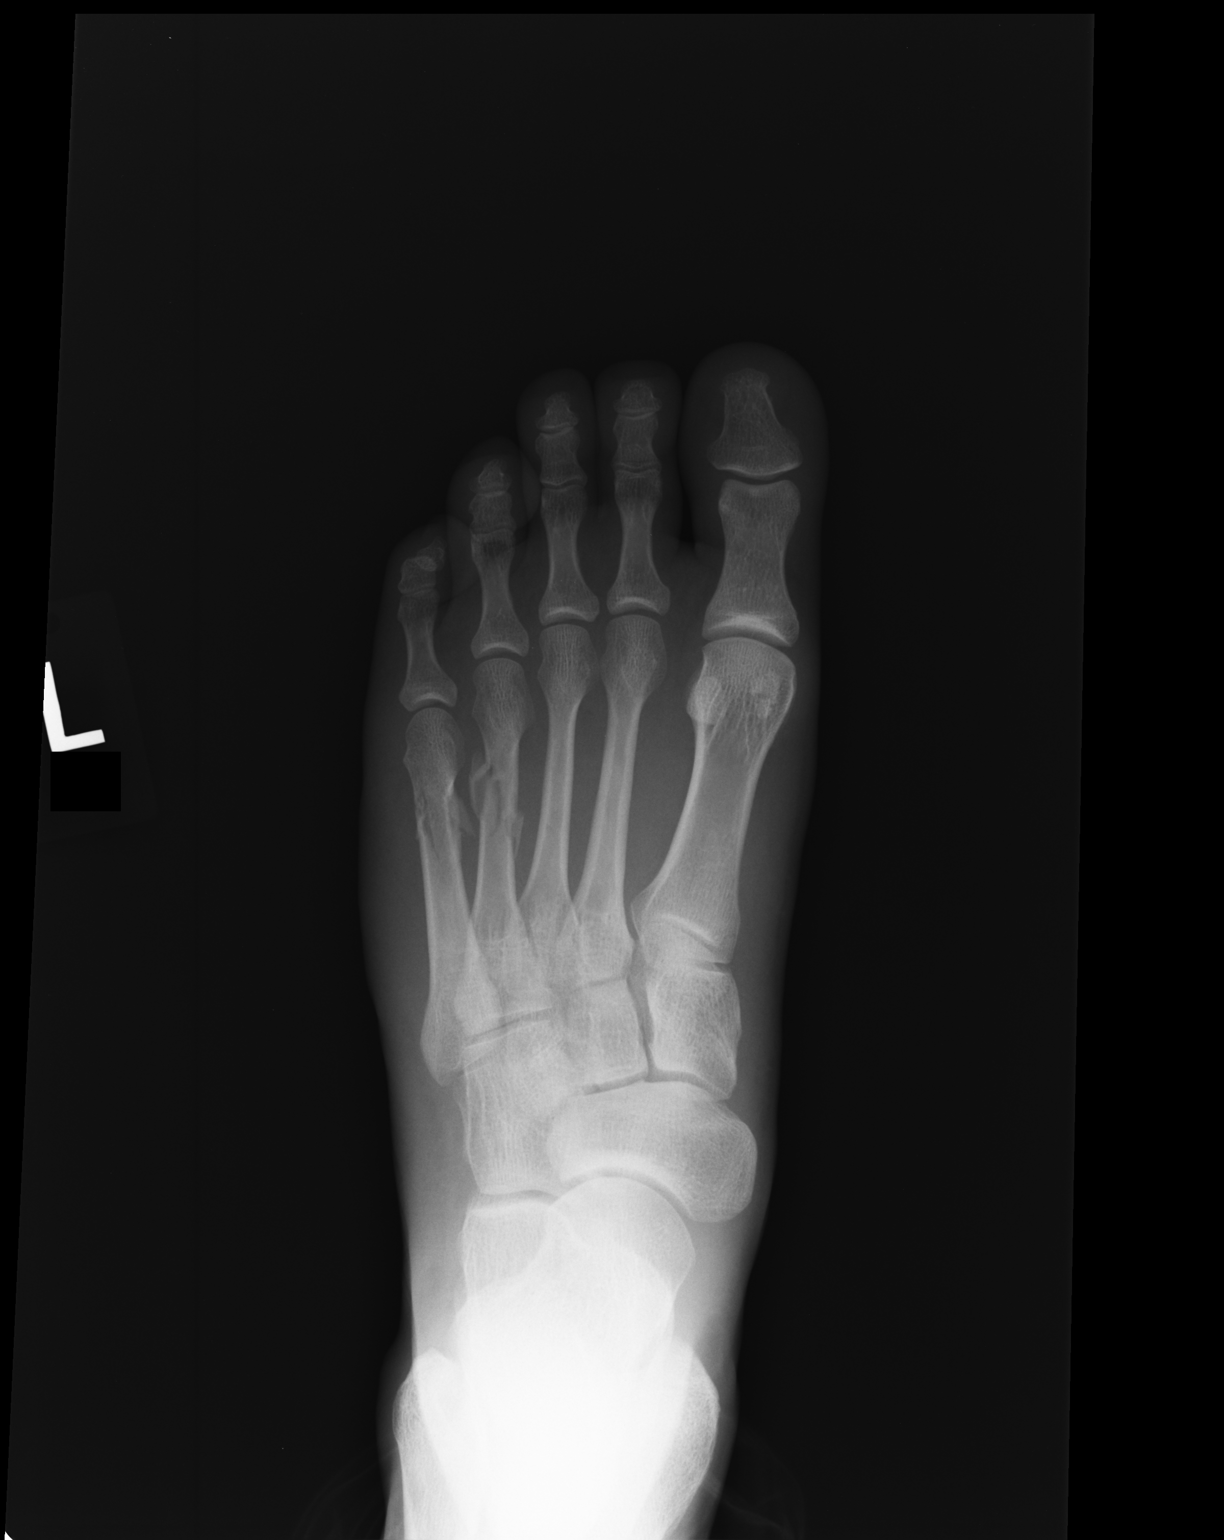

[ap obl int rot]
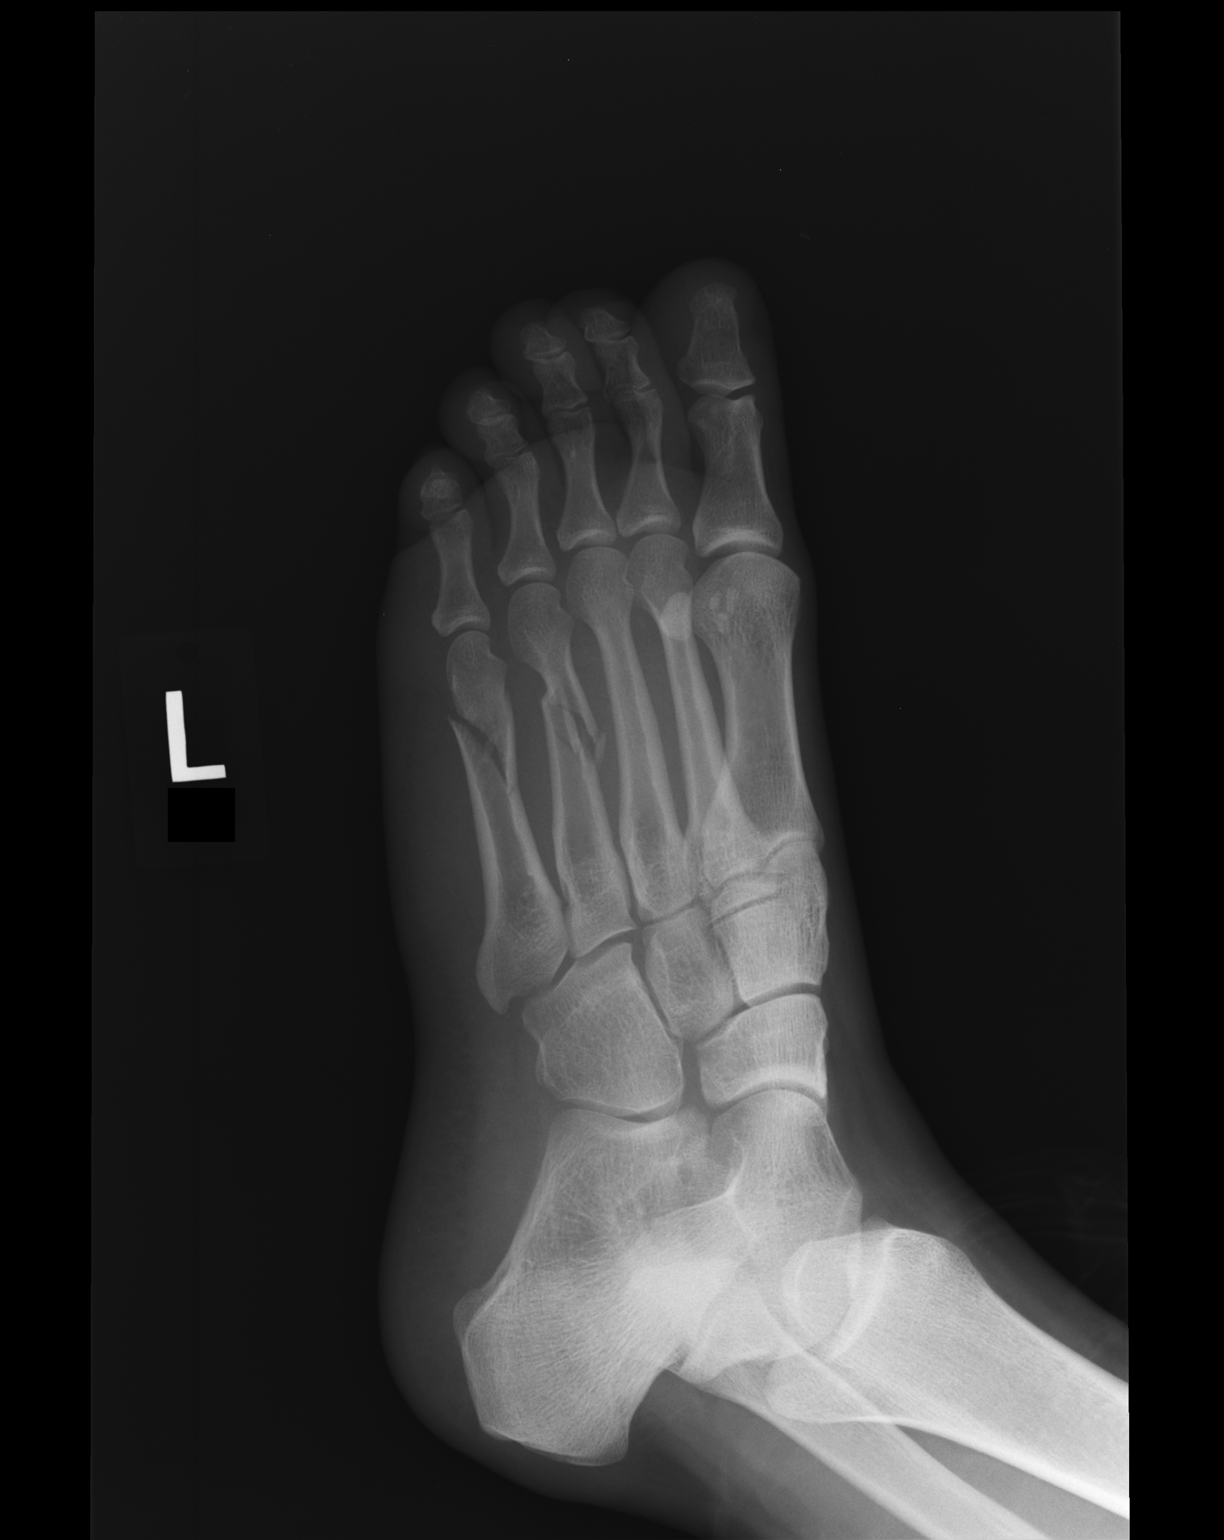

[lateral]
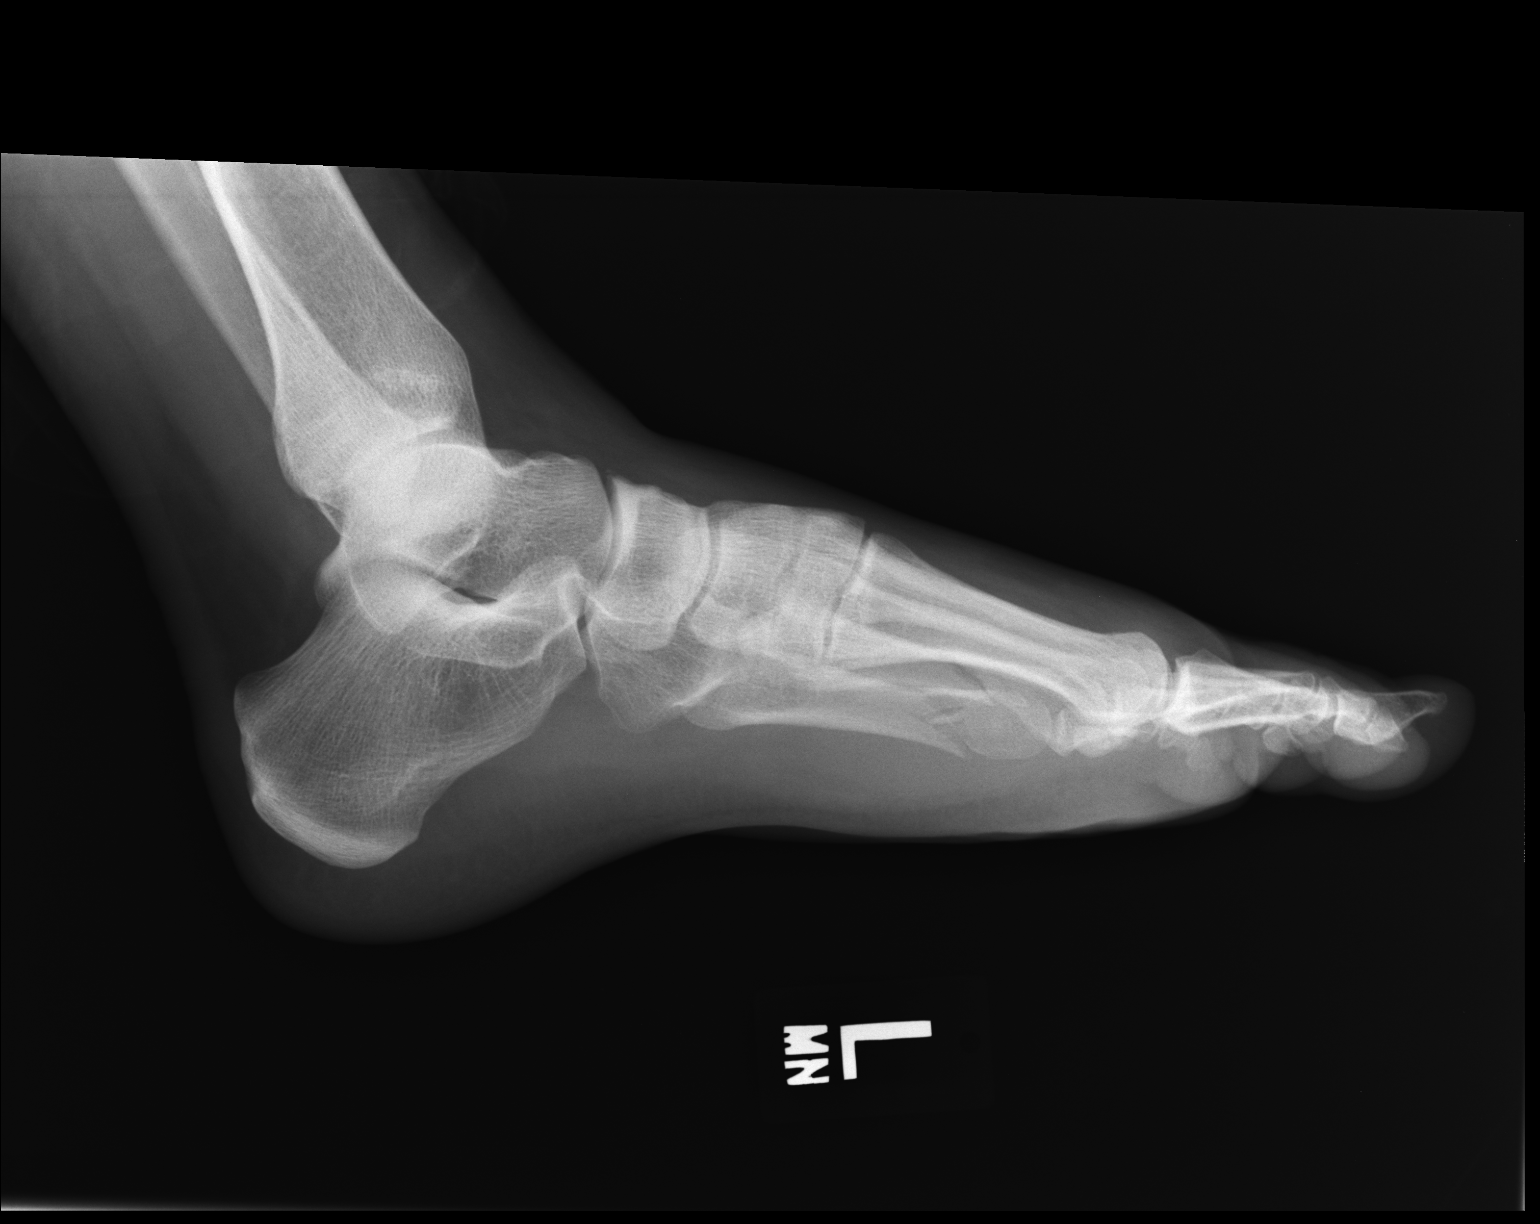

[3 of 3 positions shown; findings below may reference images not displayed]

FINDINGS: Acute obliquely oriented fractures through the mid aspects of the
fourth and fifth metatarsals. There is associated soft tissue
swelling. The remainder the visualized bones and joints are
unremarkable.
IMPRESSION: Acute fractures through the mid aspects of the fourth and fifth
metatarsals with associated soft tissue swelling.

## 2015-08-26 NOTE — Progress Notes (Signed)
[facility] [address], [place] [address] [phone]  Date:  08/26/2015   Name:  Desiree Mcclure   DOB:  [birth year]   MRN:  [id number]  PCP:  Lottie Dawson, MD   Chief Complaint  Patient presents with  . Foot Injury    left foot injury after a fall on Wednesday   History of Present Illness:  Desiree Mcclure is a 25 y.o. female patient who presents to [facility] for cc of left foot pain Patient was jumping barefoot, where she inverted her foot 3 days ago.  She had intense pain.  The next day swelling was impressive along with heavy bruising..  She has no numbness or tingling.  She has elevated and iced.  She can ambulate but barely.  She has never fractured this ankle.   Patient Active Problem List   Diagnosis Date Noted  . BRCA gene positive   . Family history of breast cancer 09/15/2012  . Counseling for travel 09/23/2010  . Tinea corporis 09/23/2010  . PLANTAR WART 11/23/2008  . CELLULITIS/ABSCESS, FOOT 02/17/2007    Past Medical History  Diagnosis Date  . Constipation   . 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 Family history of BRCA1 gene positive     father     Past Surgical History  Procedure Laterality Date  . Mole removal of toe      Social History  Substance Use Topics  . Smoking status: Never Smoker   . Smokeless tobacco: None  . Alcohol Use: 3.6 oz/week    6 Standard drinks or equivalent per week    Family History  Problem Relation Age of Onset  . BRCA 1/2 Father     BRCA1, [id number]; [employer]  . Skin cancer Father   . Breast cancer Paternal Aunt 40  . Breast cancer Maternal Grandmother 45    bilateral; 14 and 42 yo  . Hyperlipidemia Maternal Grandmother   . Hypertension Maternal Grandmother   . Colon cancer Maternal Grandfather 37  . Diabetes Maternal Grandfather   . Heart disease Maternal Grandfather   . Hypertension Maternal Grandfather   . Stroke Maternal Grandfather   . BRCA 1/2 Paternal  Grandfather     ?  . Diabetes Paternal Grandfather   . Breast cancer Paternal Aunt 58  . Uterine cancer Paternal Grandmother   . Heart attack Paternal Grandmother     No Known Allergies  Medication list has been reviewed and updated.  No current outpatient prescriptions on file prior to visit.   No current facility-administered medications on file prior to visit.    ROS ROS otherwise unremarkable unless listed above.   Physical Examination: BP 128/80 mmHg  Pulse 90  Temp(Src) 97.9 F (36.6 C) (Oral)  Resp 18  Ht 5' 4.5" (1.638 m)  Wt 131 lb 2 oz (59.478 kg)  BMI 22.17 kg/m2  SpO2 99%  LMP 07/28/2015 Ideal Body Weight: Weight in (lb) to have BMI = 25: 147.6  Physical Exam  Constitutional: She is oriented to person, place, and time. She appears well-developed and well-nourished. No distress.  HENT:  Head: Normocephalic and atraumatic.  Right Ear: External ear normal.  Left Ear: External ear normal.  Eyes: Conjunctivae and EOM are normal. Pupils are equal, round, and reactive to light.  Cardiovascular: Normal rate.   Pulmonary/Chest: Effort normal and breath sounds normal. No respiratory distress. She has no decreased breath sounds. She has no wheezes. She  has no rhonchi.  Musculoskeletal:  Swelling along the entire foot, ecchymosis and swelling at the entire foot.  More pronounced along plantar of the lateral side. Tender upon palpation to 4th and 5th metatarsal.  Pain with passive dorsiflexion of the 4th and 5th.    Neurological: She is alert and oriented to person, place, and time.  Skin: She is not diaphoretic.  Psychiatric: She has a normal mood and affect. Her behavior is normal.   Dg Foot Complete Left  08/26/2015  CLINICAL DATA:  25 year old female with left lateral foot pain after jumping injury EXAM: LEFT FOOT - COMPLETE 3+ VIEW COMPARISON:  None. FINDINGS: Acute obliquely oriented fractures through the mid aspects of the fourth and fifth metatarsals. There is  associated soft tissue swelling. The remainder the visualized bones and joints are unremarkable. IMPRESSION: Acute fractures through the mid aspects of the fourth and fifth metatarsals with associated soft tissue swelling. Electronically Signed   By: Jacqulynn Cadet M.D.   On: 08/26/2015 13:39     Assessment and Plan: Desiree Mcclure is a 25 y.o. female who is here today for foot pain. 4th and 5th metatarsal fractures.  Placed in cam walker boot and crutches.  Advised no weight bearing --orthopedic consult appreciated at this time.  They are requesting Sports Medicine & Joint Replacement.   Left foot pain - Plan: DG Foot Complete Left, Ambulatory referral to Orthopedic Surgery  Fracture of 4th metatarsal, left, closed, initial encounter - Plan: Ambulatory referral to Orthopedic Surgery  Fracture of 5th metatarsal, left, closed, initial encounter - Plan: Ambulatory referral to Orthopedic Surgery   Ivar Drape, PA-C [facility] 08/26/2015 12:32 PM

## 2015-08-26 NOTE — Patient Instructions (Addendum)
IF you received an x-ray today, you will [facility] an [facility] from [facility]. Please contact [facility] at [phone] with questions or concerns regarding your invoice.   IF you received labwork today, you will receive an invoice from [facility]. Please contact [facility] at [phone] with questions or concerns regarding your invoice.   Our billing staff will not be able to assist you with questions regarding bills from these companies.  You will be contacted with the lab results as soon as they are available. The fastest way to get your results is to activate your My Chart account. Instructions are located on the last page of this paperwork. If you have not heard from us regarding the results in 2 weeks, please contact this office.    Please ice the foot and elevate.  I would like you to not place any weight on the left foot.   Please await contact for this ortho referral.  You can use tylenol for the pain.  Metatarsal Fracture A metatarsal fracture is a break in a metatarsal bone. Metatarsal bones connect your toe bones to your ankle bones. CAUSES This type of fracture may be caused by:  A sudden twisting of your foot.  A fall onto your foot.  Overuse or repetitive exercise. RISK FACTORS This condition is more likely to develop in people who:  Play contact sports.  Have a bone disease.  Have a low calcium level. SYMPTOMS Symptoms of this condition include:  Pain that is worse when walking or standing.  Pain when pressing on the foot or moving the toes.  Swelling.  Bruising on the top or bottom of the foot.  A foot that appears shorter than the other one. DIAGNOSIS This condition is diagnosed with a physical exam. You may also have imaging tests, such as:  X-rays.  A CT scan.  MRI. TREATMENT Treatment for this condition depends on its severity and whether a bone has moved out of place. Treatment may  involve:  Rest.  Wearing foot support such as a cast, splint, or boot for several weeks.  Using crutches.  Surgery to move bones back into the right position. Surgery is usually needed if there are many pieces of broken bone or bones that are very out of place (displaced fracture).  Physical therapy. This may be needed to help you regain full movement and strength in your foot. You will need to return to your health care provider to have X-rays taken until your bones heal. Your health care provider will look at the X-rays to make sure that your foot is healing well. HOME CARE INSTRUCTIONS  If You Have a Cast:  Do not stick anything inside the cast to scratch your skin. Doing that increases your risk of infection.  Check the skin around the cast every day. Report any concerns to your health care provider. You may put lotion on dry skin around the edges of the cast. Do not apply lotion to the skin underneath the cast.  Keep the cast clean and dry. If You Have a Splint or a Supportive Boot:  Wear it as directed by your health care provider. Remove it only as directed by your health care provider.  Loosen it if your toes become numb and tingle, or if they turn cold and blue.  Keep it clean and dry. Bathing  Do not take baths, swim, or use a hot tub until your health care provider approves. Ask your health care provider if you  can take showers. You may only be allowed to take sponge baths for bathing.  If your health care provider approves bathing and showering, cover the cast or splint with a watertight plastic bag to protect it from water. Do not let the cast or splint get wet. Managing Pain, Stiffness, and Swelling  If directed, apply ice to the injured area (if you have a splint, not a cast).  Put ice in a plastic bag.  Place a towel between your skin and the bag.  Leave the ice on for 20 minutes, 2-3 times per day.  Move your toes often to avoid stiffness and to lessen  swelling.  Raise (elevate) the injured area above the level of your heart while you are sitting or lying down. Driving  Do not drive or operate heavy machinery while taking pain medicine.  Do not drive while wearing foot support on a foot that you use for driving. Activity  Return to your normal activities as directed by your health care provider. Ask your health care provider what activities are safe for you.  Perform exercises as directed by your health care provider or physical therapist. Safety  Do not use the injured foot to support your body weight until your health care provider says that you can. Use crutches as directed by your health care provider. General Instructions  Do not put pressure on any part of the cast or splint until it is fully hardened. This may take several hours.  Do not use any tobacco products, including cigarettes, chewing tobacco, or e-cigarettes. Tobacco can delay bone healing. If you need help quitting, ask your health care provider.  Take medicines only as directed by your health care provider.  Keep all follow-up visits as directed by your health care provider. This is important. SEEK MEDICAL CARE IF:  You have a fever.  Your cast, splint, or boot is too loose or too tight.  Your cast, splint, or boot is damaged.  Your pain medicine is not helping.  You have pain, tingling, or numbness in your foot that is not going away. SEEK IMMEDIATE MEDICAL CARE IF:  You have severe pain.  You have tingling or numbness in your foot that is getting worse.  Your foot feels cold or becomes numb.  Your foot changes color.   This information is not intended to replace advice given to you by your health care provider. Make sure you discuss any questions you have with your health care provider.   Document Released: 01/19/2002 Document Revised: 09/13/2014 Document Reviewed: 02/23/2014 Elsevier Interactive Patient Education [employer]

## 2015-08-28 ENCOUNTER — Telehealth: Payer: Self-pay | Admitting: Family Medicine

## 2015-08-28 NOTE — Telephone Encounter (Signed)
Pt left a message on my machine.  Asking for a return call about abnormal test results.

## 2015-08-29 NOTE — Telephone Encounter (Signed)
Spoke to the pt.  She was inquiring about the elevated WBC.  Informed her that usually caused by recent acute illness or infection.  May have been fighting off an illness.

## 2017-01-31 ENCOUNTER — Encounter: Payer: Self-pay | Admitting: Internal Medicine

## 2017-09-16 ENCOUNTER — Other Ambulatory Visit: Payer: Self-pay | Admitting: Obstetrics

## 2017-09-16 DIAGNOSIS — Z1509 Genetic susceptibility to other malignant neoplasm: Principal | ICD-10-CM

## 2017-09-16 DIAGNOSIS — Z1501 Genetic susceptibility to malignant neoplasm of breast: Secondary | ICD-10-CM

## 2018-01-29 ENCOUNTER — Other Ambulatory Visit: Payer: Self-pay | Admitting: Obstetrics

## 2018-01-29 DIAGNOSIS — Z1509 Genetic susceptibility to other malignant neoplasm: Principal | ICD-10-CM

## 2018-01-29 DIAGNOSIS — Z1501 Genetic susceptibility to malignant neoplasm of breast: Secondary | ICD-10-CM

## 2018-10-09 ENCOUNTER — Ambulatory Visit: Payer: Self-pay | Admitting: *Deleted

## 2018-10-09 ENCOUNTER — Ambulatory Visit: Payer: Self-pay

## 2018-10-09 NOTE — Telephone Encounter (Signed)
If she has   Very rapid or very low hr belo50 and below or above 120   Of short of breath chest pain  or faint like she should seek care  UC or ed  Otherwise plan Ov  But  I am out of office next week and may have to see another provider as  Indicated

## 2018-10-09 NOTE — Telephone Encounter (Signed)
NT read note left by Dr Fabian Sharp to patient. Pt verbalized understanding of all instructions. She will call Monday to set up appointment.  Answer Assessment - Initial Assessment Questions 1. REASON FOR CALL or QUESTION: "What is your reason for calling today?" or "How can I best help you?" or "What question do you have that I can help answer?"     NT read note left by Dr Fabian Sharp to patient. Pt verbalized understanding of all instructions. She will call Monday to set up appointment.  Protocols used: INFORMATION ONLY CALL-A-AH

## 2018-10-09 NOTE — Telephone Encounter (Signed)
lvm for pt to call back okay for nurse triage to disclose crm created

## 2018-10-09 NOTE — Telephone Encounter (Signed)
Patient is calling to report she has had changes in how she feels- she has been feeling regular/irregular heart rates- but she states that they are getting more frequent and she feels the change in she chest. They are not painful- but she is aware. She is unable to count her heart rate or give information about her present rate- therefore advised her UC for stability check and call Monday for follow up with PCP. She voices understanding.  Reason for Disposition . [1] Skipped or extra beat(s) AND [2] occurs 4 or more times per minute  Answer Assessment - Initial Assessment Questions 1. DESCRIPTION: "Please describe your heart rate or heart beat that you are having" (e.g., fast/slow, regular/irregular, skipped or extra beats, "palpitations")     Regular/irregular 2. ONSET: "When did it start?" (Minutes, hours or days)      1 week 3. DURATION: "How long does it last" (e.g., seconds, minutes, hours)     Patient feels it in her chest- feels her heart is pumping harder- prolonged  time- not sure how long 4. PATTERN "Does it come and go, or has it been constant since it started?"  "Does it get worse with exertion?"   "Are you feeling it now?"     Constant, has not noticed it getting worse with activity, patient felt it earlier 5. TAP: "Using your hand, can you tap out what you are feeling on a chair or table in front of you, so that I can hear?" (Note: not all patients can do this)       Feels rate is irregular every few minutes 6. HEART RATE: "Can you tell me your heart rate?" "How many beats in 15 seconds?"  (Note: not all patients can do this)       no 7. RECURRENT SYMPTOM: "Have you ever had this before?" If so, ask: "When was the last time?" and "What happened that time?"      Nothing for extended period of time- 1 year ago 8. CAUSE: "What do you think is causing the palpitations?"     no 9. CARDIAC HISTORY: "Do you have any history of heart disease?" (e.g., heart attack, angina, bypass surgery,  angioplasty, arrhythmia)      no 10. OTHER SYMPTOMS: "Do you have any other symptoms?" (e.g., dizziness, chest pain, sweating, difficulty breathing)       no 11. PREGNANCY: "Is there any chance you are pregnant?" "When was your last menstrual period?"       No- LMP- 5/8-5/13  Protocols used: HEART RATE AND HEARTBEAT QUESTIONS-A-AH

## 2018-10-12 ENCOUNTER — Ambulatory Visit: Payer: BLUE CROSS/BLUE SHIELD | Admitting: Family Medicine

## 2018-10-12 ENCOUNTER — Other Ambulatory Visit: Payer: Self-pay

## 2018-10-12 ENCOUNTER — Encounter: Payer: Self-pay | Admitting: Family Medicine

## 2018-10-12 VITALS — BP 106/64 | HR 94 | Temp 98.3°F | Wt 113.4 lb

## 2018-10-12 DIAGNOSIS — R002 Palpitations: Secondary | ICD-10-CM

## 2018-10-12 NOTE — Progress Notes (Signed)
   Subjective:    Patient ID: Desiree Mcclure, female    DOB: [birth year], 28 y.o.   MRN: [id number]  HPI Here to follow up an urgent care visit for palpitations. These started about 10 days ago, and she has a hard time describing them. She says her heart does not skip or race but that it pounds or beats hard off and on. No chest pain or SOB. No cough or fever. She typically drinks 4 cups of caffeinated coffee a day, but that she has great ly decreased this to either one cup or none over the past 3 days. Consequently the palpitations have totally stopped. She takes no other medications or supplements, other than a multivitamin daily. She went to urgent care on 10-09-18 and reportedly had a normal exam. However an EKG showed some extra beats (but I do not have a copy of this report). No labs were drawn. She was advised to decrease her use of caffeine, which she has done. She has lost 17 lbs in the past 3 months, but she says she intended to lose this weight because she did not feel comfortable in her clothes. Her weight has now been stable for the last month.    Review of Systems  Constitutional: Negative.   Respiratory: Negative.   Cardiovascular: Positive for palpitations. Negative for chest pain and leg swelling.  Neurological: Negative.        Objective:   Physical Exam Constitutional:      General: She is not in acute distress.    Appearance: Normal appearance.  Cardiovascular:     Rate and Rhythm: Normal rate and regular rhythm.     Pulses: Normal pulses.     Heart sounds: Normal heart sounds.     Comments: EKG shows normal sinus rhythm  Pulmonary:     Effort: Pulmonary effort is normal.     Breath sounds: Normal breath sounds.  Neurological:     General: No focal deficit present.     Mental Status: She is alert and oriented to person, place, and time.           Assessment & Plan:  She likely has had some ectopy, but this seems to have resolved with a reduction in  caffeine intake. She will recheck prn.  Gershon Crane, MD

## 2018-11-18 ENCOUNTER — Other Ambulatory Visit: Payer: Self-pay | Admitting: Obstetrics

## 2018-11-18 DIAGNOSIS — Z1509 Genetic susceptibility to other malignant neoplasm: Secondary | ICD-10-CM

## 2018-11-18 DIAGNOSIS — Z1501 Genetic susceptibility to malignant neoplasm of breast: Secondary | ICD-10-CM

## 2019-06-29 ENCOUNTER — Other Ambulatory Visit: Payer: Self-pay | Admitting: Obstetrics

## 2019-07-01 ENCOUNTER — Other Ambulatory Visit: Payer: BLUE CROSS/BLUE SHIELD

## 2019-07-02 ENCOUNTER — Other Ambulatory Visit: Payer: Self-pay

## 2019-07-02 ENCOUNTER — Ambulatory Visit
Admission: RE | Admit: 2019-07-02 | Discharge: 2019-07-02 | Disposition: A | Discharge status: Home / Self Care | Payer: BC Managed Care – PPO | Source: Ambulatory Visit | Attending: Obstetrics | Admitting: Obstetrics

## 2019-07-02 DIAGNOSIS — Z1501 Genetic susceptibility to malignant neoplasm of breast: Secondary | ICD-10-CM

## 2019-07-02 DIAGNOSIS — Z1509 Genetic susceptibility to other malignant neoplasm: Secondary | ICD-10-CM

## 2019-07-02 IMAGING — MR MR BREAST BILAT WO/W CM
8 of 12 series · 33 of 48 positions shown · IV contrast (gadavist)
Comparison: Bilateral screening mammogram [DATE]

CLINICAL DATA: 28-year-old patient BRCA 1 positive patient presents
for screening breast MRI. She had a negative screening mammogram at
[facility] in [DATE].

LABS:  None obtained.
EXAM:
BILATERAL BREAST MRI WITH AND WITHOUT CONTRAST
TECHNIQUE: Multiplanar, multisequence MR images of both breasts were obtained
prior to and following the intravenous administration of 5 ml of
Gadavist

[Series 2: t2_tirm_tra ipat (a-p) · axial · 3.0mm · 0.64mm/px · 1 of 55 slices shown]
[im 1/55]
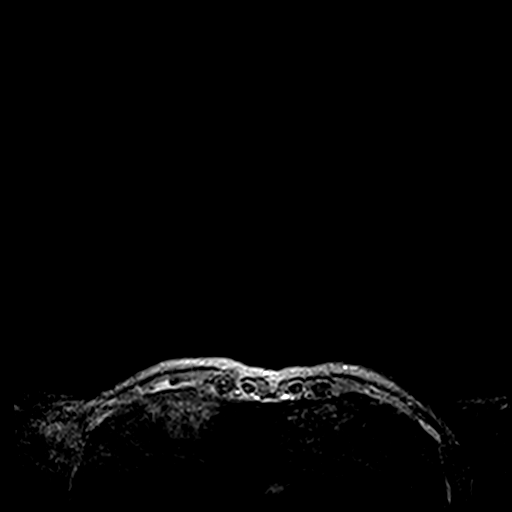

[Series 3: fl3d pre-cm no · axial · non-contrast · 1.2mm · 0.86mm/px · z∈[-62,+110]mm · 5 of 144 slices shown]
[im 1/144]
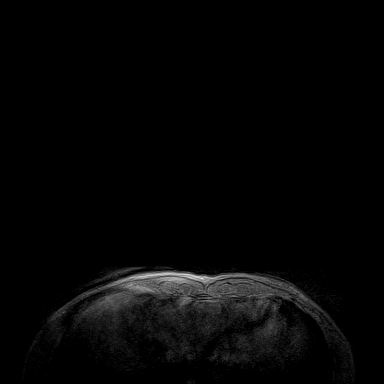
[im 36/144]
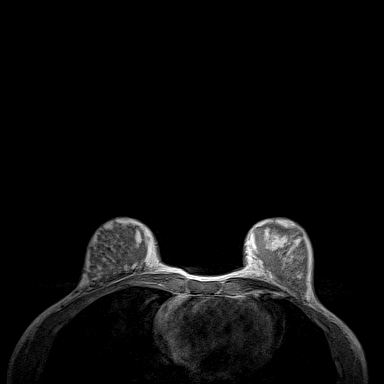
[im 72/144]
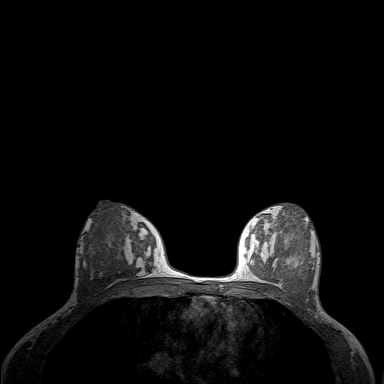
[im 108/144]
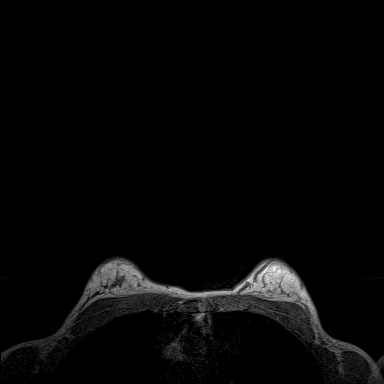
[im 144/144]
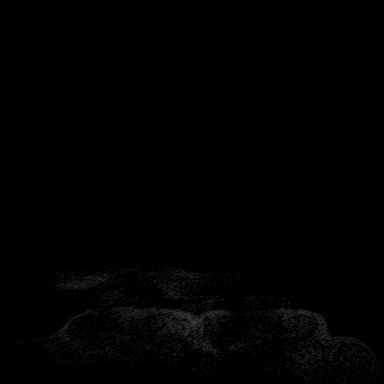

[Series 4: fl3d pre-cm · axial · non-contrast · 1.2mm · 0.86mm/px · z∈[-62,+110]mm · 5 of 144 slices shown]
[im 1/144]
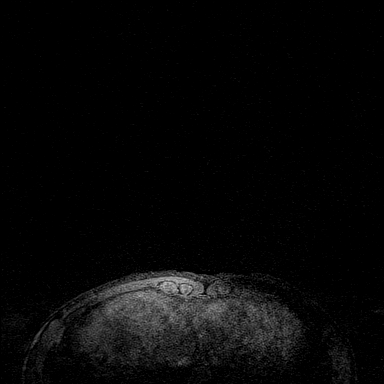
[im 36/144]
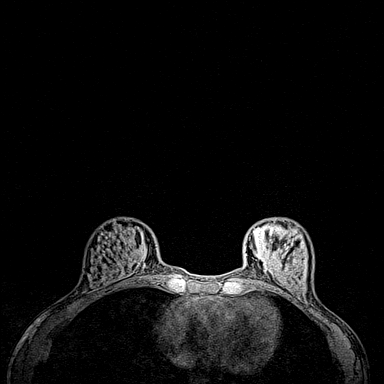
[im 72/144]
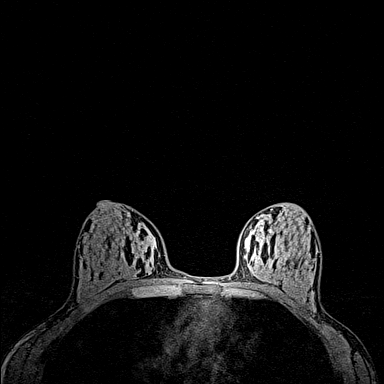
[im 108/144]
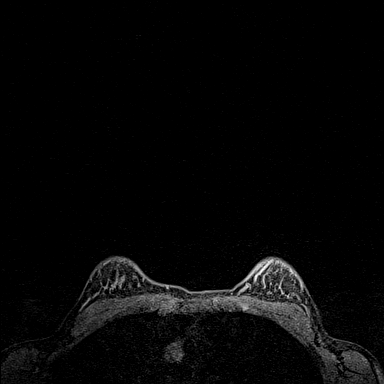
[im 144/144]
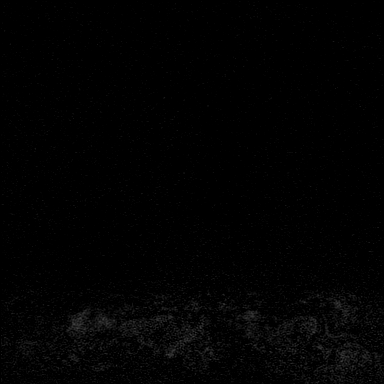

[Series 5: fl3d post-cm 20 · axial · 1.2mm · 0.86mm/px · z∈[-62,+110]mm · 5 of 144 slices shown (1 of 3)]
[im 1/144]
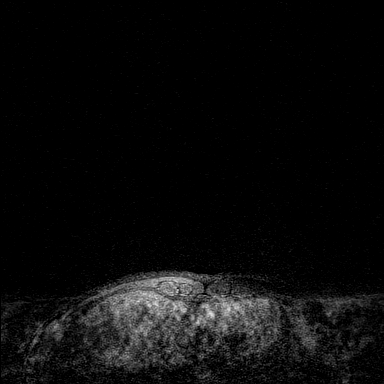
[im 36/144]
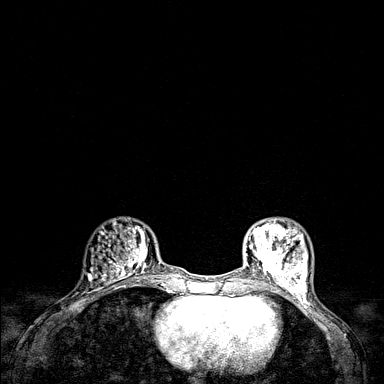
[im 72/144]
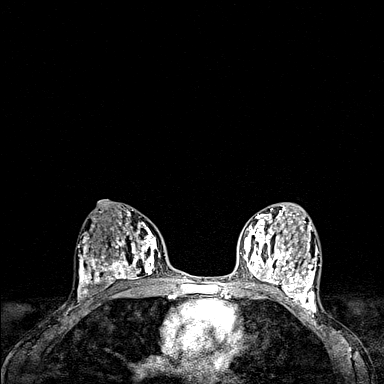
[im 108/144]
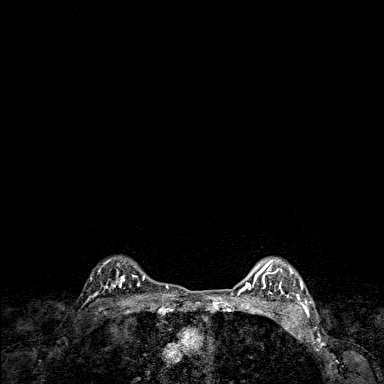
[im 144/144]
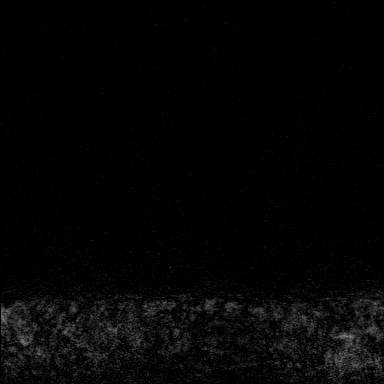

[Series 6: fl3d post-cm 20 · axial · 1.2mm · 0.86mm/px · z∈[-62,+110]mm · 5 of 144 slices shown (2 of 3)]
[im 1/144]
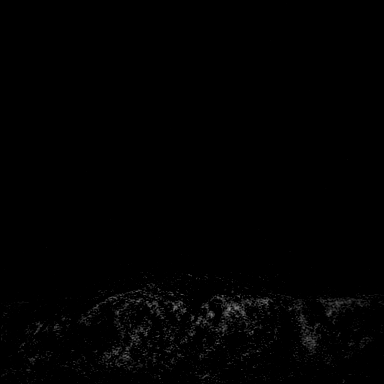
[im 36/144]
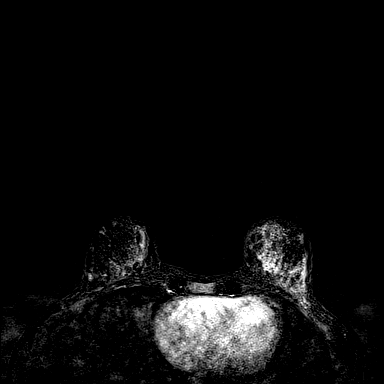
[im 72/144]
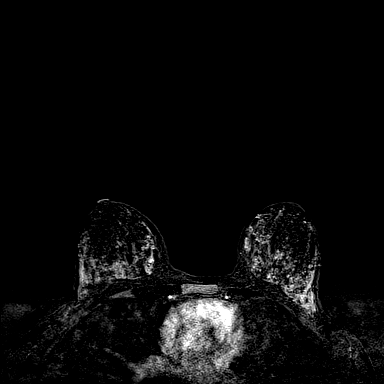
[im 108/144]
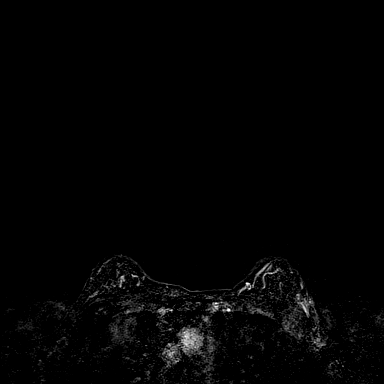
[im 144/144]
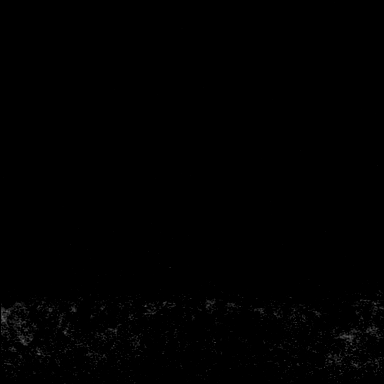

[Series 7: fl3d post-cm 20 · axial · 172.8mm · 0.86mm/px · 1 of 1 slices shown (3 of 3)]
[im 1/1]
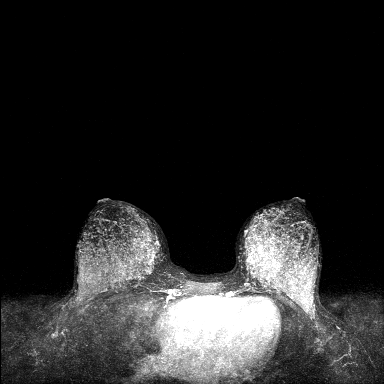

[Series 8: fl3d post-cm 3min · axial · 1.2mm · 0.86mm/px · z∈[-62,+110]mm · 6 of 144 slices shown]
[im 1/144]
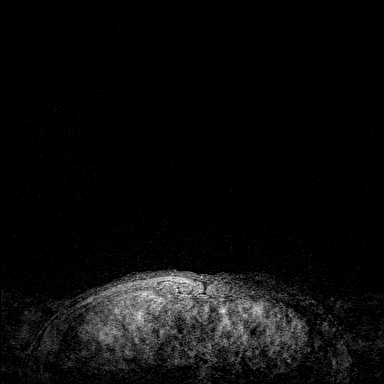
[im 29/144]
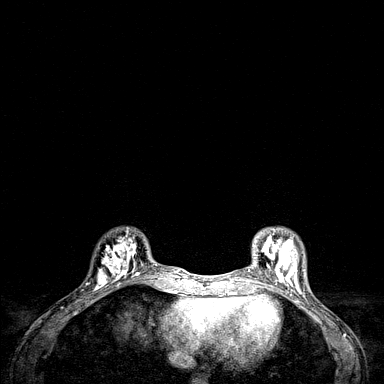
[im 58/144]
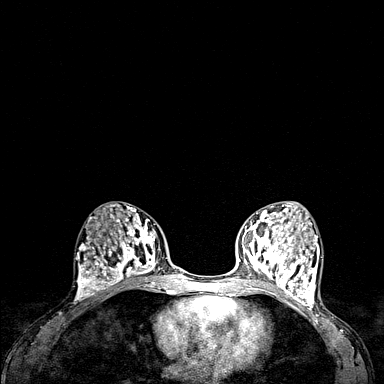
[im 86/144]
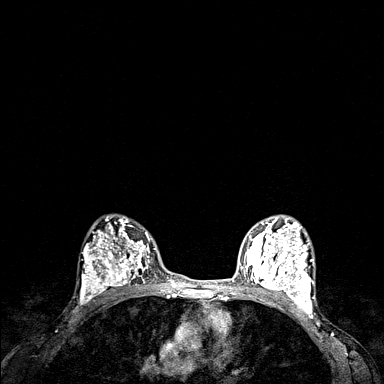
[im 115/144]
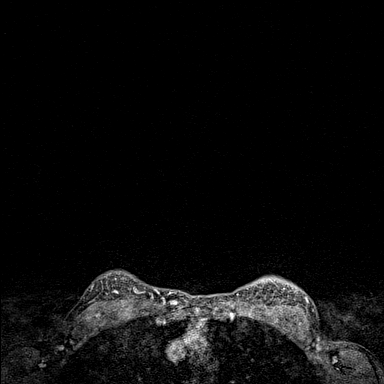
[im 144/144]
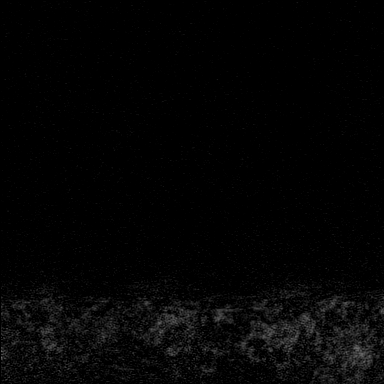

[Series 9: fl3d post-cm 3min_sub · axial · 1.2mm · 0.86mm/px · z∈[-62,+75]mm · 5 of 144 slices shown]
[im 1/144]
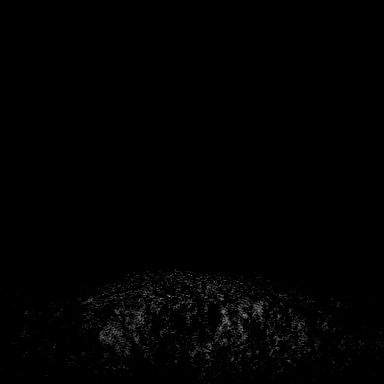
[im 29/144]
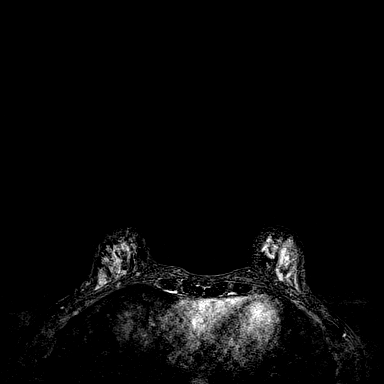
[im 58/144]
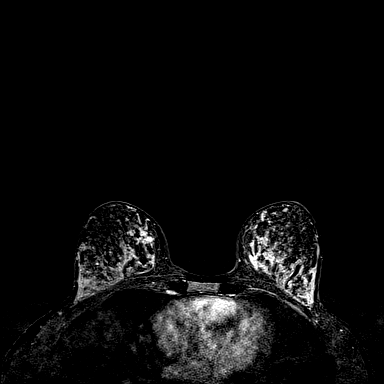
[im 86/144]
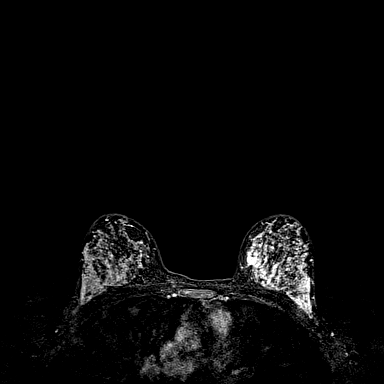
[im 115/144]
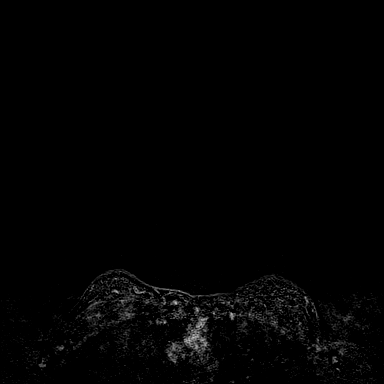

[33 of 48 positions shown; findings below may reference images not displayed]

Three-dimensional MR images were rendered by post-processing of the
original MR data on an independent workstation. The
three-dimensional MR images were interpreted, and findings are
reported in the following complete MRI report for this study. Three
dimensional images were evaluated at the independent [employer]
workstation
FINDINGS: Breast composition: d. Extreme fibroglandular tissue.

Background parenchymal enhancement: Moderate

Right breast: No mass or abnormal enhancement.

Left breast: No mass or abnormal enhancement.

Lymph nodes: No abnormal appearing lymph nodes.

Ancillary findings:  None.
IMPRESSION: No MRI evidence of malignancy in either breast.

RECOMMENDATION:
Bilateral screening mammogram is recommended in [DATE].

Consider bilateral screening breast MRI in 1 year.

BI-RADS CATEGORY  1: Negative.

## 2019-07-02 MED ORDER — GADOBUTROL 1 MMOL/ML IV SOLN
5.0000 mL | Freq: Once | INTRAVENOUS | Status: AC | PRN
  Administered 2019-07-02: 5 mL via INTRAVENOUS

## 2020-05-25 ENCOUNTER — Other Ambulatory Visit: Payer: Self-pay | Admitting: Obstetrics

## 2020-05-25 DIAGNOSIS — Z1501 Genetic susceptibility to malignant neoplasm of breast: Secondary | ICD-10-CM

## 2020-07-01 ENCOUNTER — Ambulatory Visit
Admission: RE | Admit: 2020-07-01 | Discharge: 2020-07-01 | Disposition: A | Discharge status: Home / Self Care | Payer: BC Managed Care – PPO | Source: Ambulatory Visit | Attending: Obstetrics | Admitting: Obstetrics

## 2020-07-01 ENCOUNTER — Other Ambulatory Visit: Payer: Self-pay

## 2020-07-01 DIAGNOSIS — Z1501 Genetic susceptibility to malignant neoplasm of breast: Secondary | ICD-10-CM

## 2020-07-01 IMAGING — MR MR BREAST BILAT WO/W CM
5 of 10 series · 31 of 48 positions shown · IV contrast (14cc multihance)
Comparison: Previous exam(s).

CLINICAL DATA: 29-year-old BRCA 1 gene mutation positive female
presenting for high risk screening MRI.

LABS:  None.
EXAM:
BILATERAL BREAST MRI WITH AND WITHOUT CONTRAST
TECHNIQUE: Multiplanar, multisequence MR images of both breasts were obtained
prior to and following the intravenous administration of 5 ml of
Gadavist

[Series 3: t2_tirm_tra ipat (a-p) · axial · 3.0mm · 0.68mm/px · z∈[-97,+65]mm · 3 of 55 slices shown]
[im 1/55]
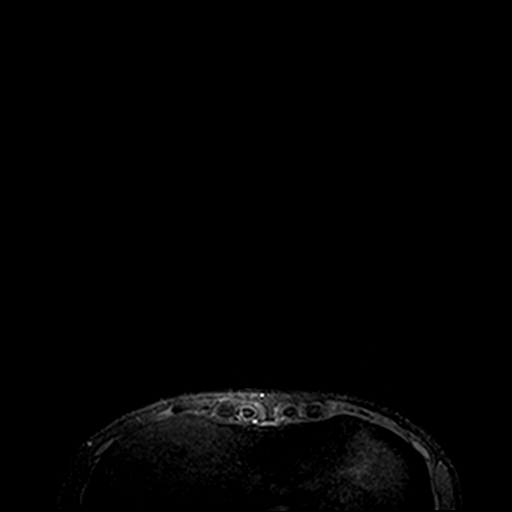
[im 28/55]
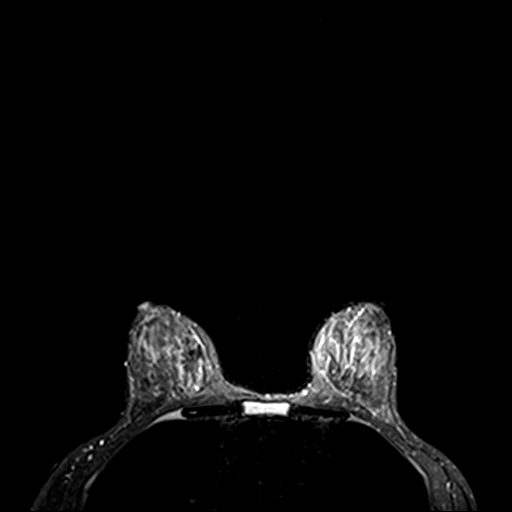
[im 55/55]
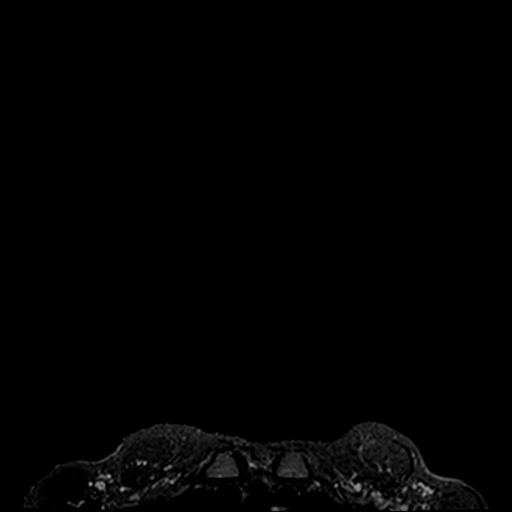

[Series 4: fl3d pre-cm no · axial · non-contrast · 1.2mm · 0.91mm/px · z∈[-89,+64]mm · 7 of 128 slices shown]
[im 1/128]
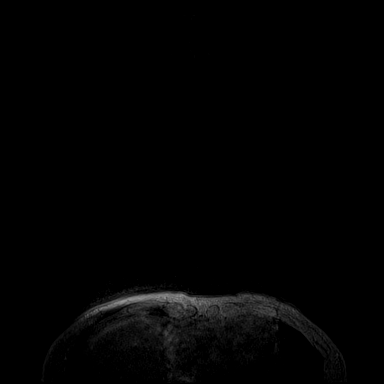
[im 22/128]
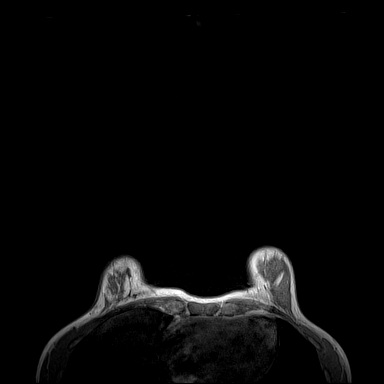
[im 43/128]
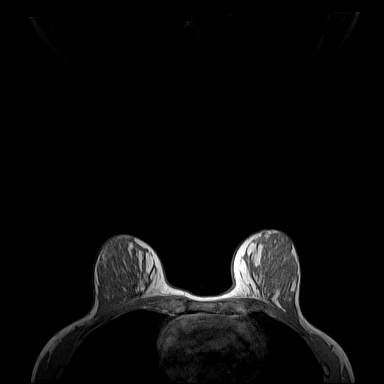
[im 64/128]
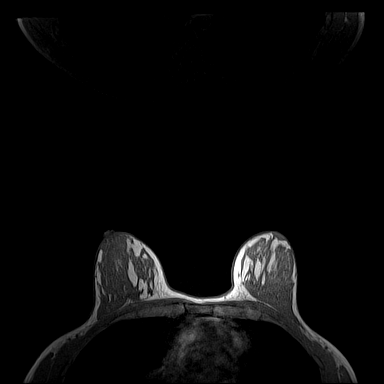
[im 85/128]
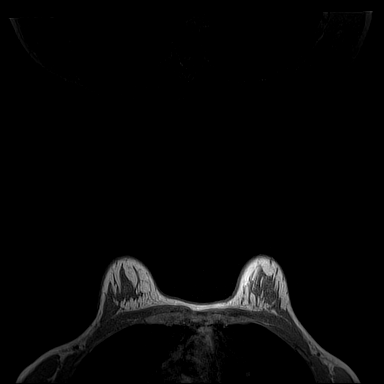
[im 106/128]
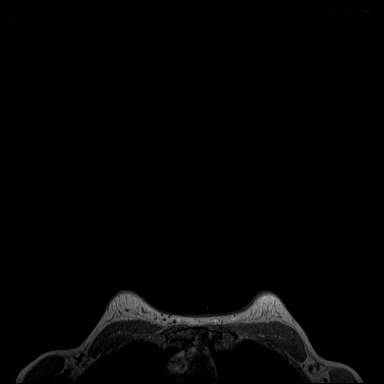
[im 128/128]
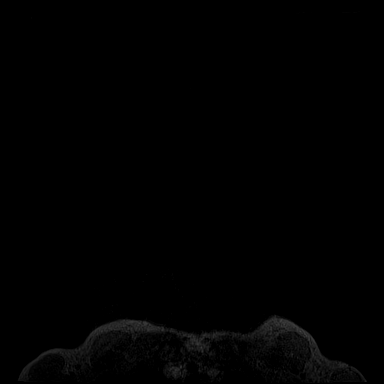

[Series 5: fl3d pre-cm · axial · non-contrast · 1.2mm · 0.91mm/px · z∈[-89,+64]mm · 7 of 128 slices shown]
[im 1/128]
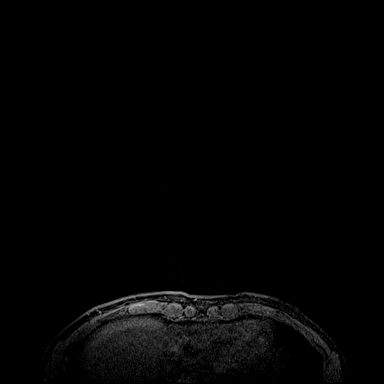
[im 22/128]
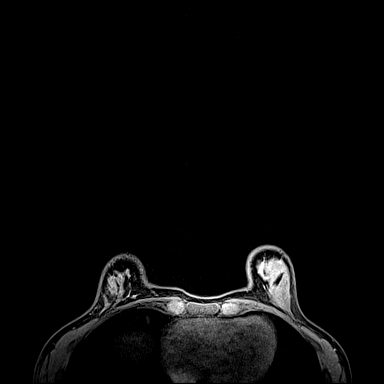
[im 43/128]
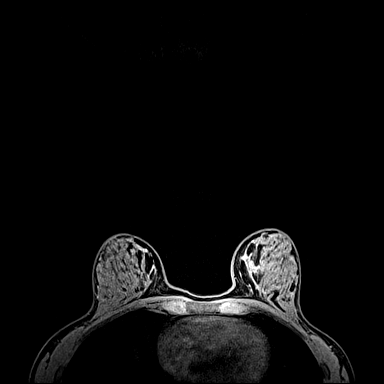
[im 64/128]
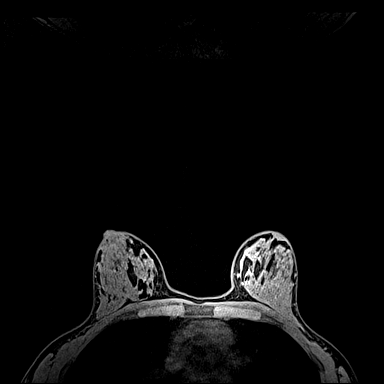
[im 85/128]
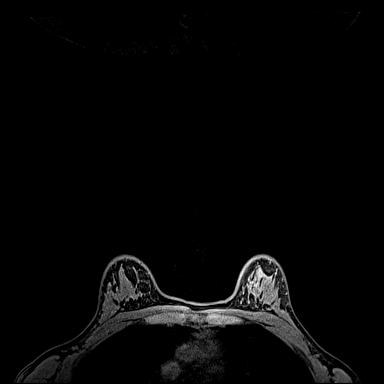
[im 106/128]
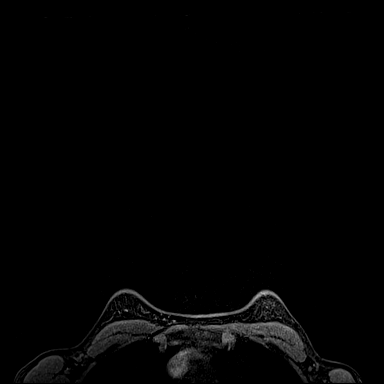
[im 128/128]
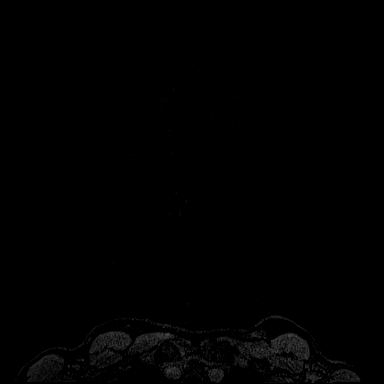

[Series 6: fl3d post immediate · axial · 1.2mm · 0.91mm/px · z∈[-89,+64]mm · 7 of 128 slices shown (1 of 2)]
[im 1/128]
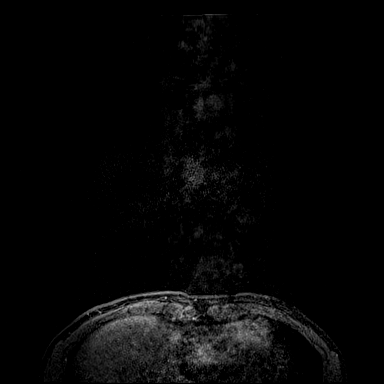
[im 22/128]
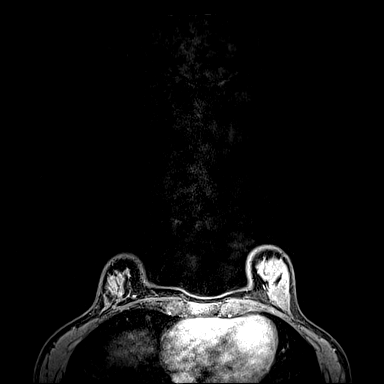
[im 43/128]
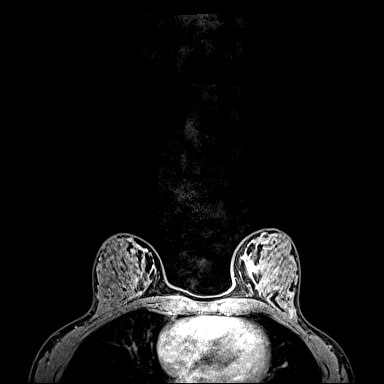
[im 64/128]
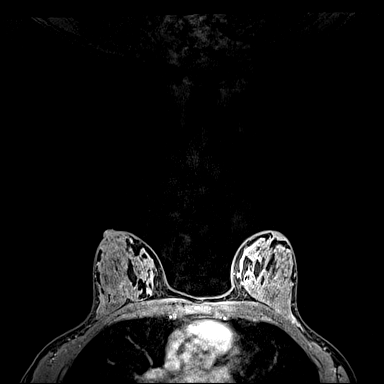
[im 85/128]
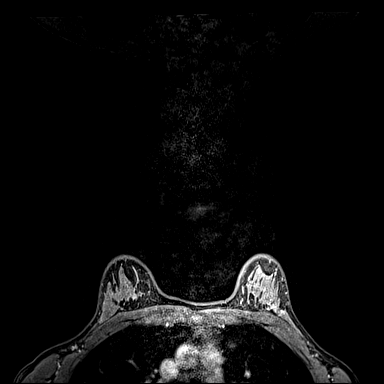
[im 106/128]
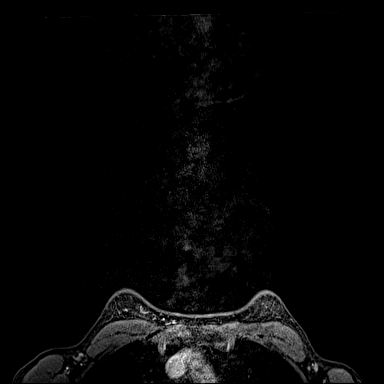
[im 128/128]
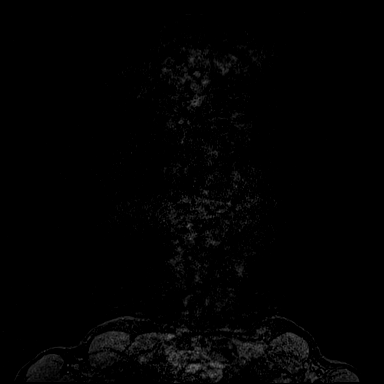

[Series 7: fl3d post immediate · axial · 1.2mm · 0.91mm/px · z∈[-89,+64]mm · 7 of 128 slices shown (2 of 2)]
[im 1/128]
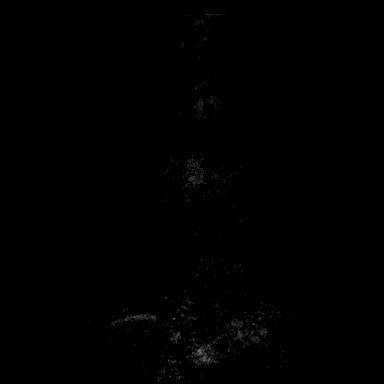
[im 22/128]
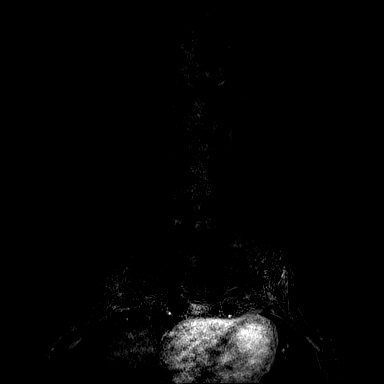
[im 43/128]
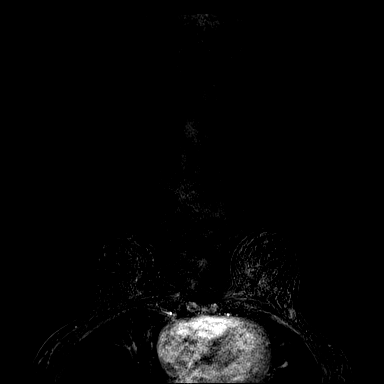
[im 64/128]
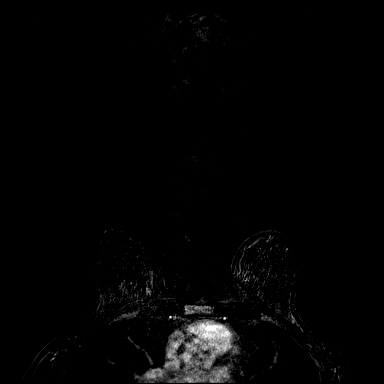
[im 85/128]
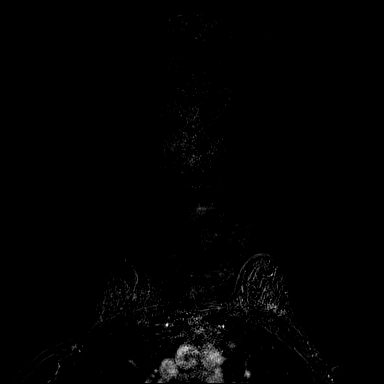
[im 106/128]
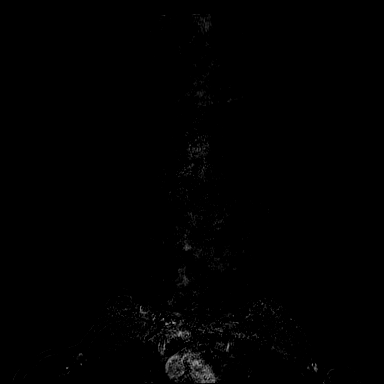
[im 128/128]
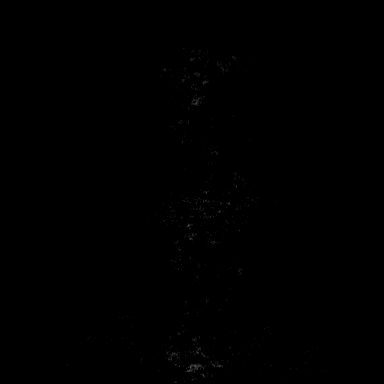

[31 of 48 positions shown; findings below may reference images not displayed]

Three-dimensional MR images were rendered by post-processing of the
original MR data on an independent workstation. The
three-dimensional MR images were interpreted, and findings are
reported in the following complete MRI report for this study. Three
dimensional images were evaluated at the independent interpreting
workstation using the Dyn[employer]CAD thin client.
FINDINGS: Breast composition: d. Extreme fibroglandular tissue.

Background parenchymal enhancement: Moderate.

Right breast: In the slightly medial posterior right breast, there
is a 3.7 cm area of linear non mass enhancement. No other masses or
suspicious areas of enhancement are identified in the right breast

Left breast: No mass or abnormal enhancement.

Lymph nodes: No abnormal appearing lymph nodes.

Ancillary findings:  None.
IMPRESSION: 1. There is a 3.7 cm area of linear non mass enhancement in the
slightly medial posterior right breast. This may be related to
asymmetric background parenchymal enhancement, though DCIS cannot be
excluded.

2.  No evidence of left breast malignancy.

RECOMMENDATION:
1. MRI guided biopsy is recommended for the anterior and posterior
margin of the linear non mass enhancement in the right breast. It
would be preferable to schedule her about 2 weeks after her original
MRI as this may represent background parenchymal enhancement and
could resolve at the time of biopsy.

BI-RADS CATEGORY  4: Suspicious.

## 2020-07-01 MED ORDER — GADOBUTROL 1 MMOL/ML IV SOLN
5.0000 mL | Freq: Once | INTRAVENOUS | Status: AC | PRN
  Administered 2020-07-01: 5 mL via INTRAVENOUS

## 2020-07-12 ENCOUNTER — Other Ambulatory Visit: Payer: Self-pay | Admitting: Obstetrics

## 2020-07-12 DIAGNOSIS — R9389 Abnormal findings on diagnostic imaging of other specified body structures: Secondary | ICD-10-CM

## 2020-07-17 ENCOUNTER — Ambulatory Visit
Admission: RE | Admit: 2020-07-17 | Discharge: 2020-07-17 | Disposition: A | Discharge status: Home / Self Care | Payer: BC Managed Care – PPO | Source: Ambulatory Visit | Attending: Obstetrics | Admitting: Obstetrics

## 2020-07-17 ENCOUNTER — Other Ambulatory Visit: Payer: Self-pay

## 2020-07-17 DIAGNOSIS — R9389 Abnormal findings on diagnostic imaging of other specified body structures: Secondary | ICD-10-CM

## 2020-07-17 IMAGING — MG MM BREAST LOCALIZATION CLIP
4 series · 4 of 12 positions shown · non-contrast
Comparison: Previous exam(s).

CLINICAL DATA: Evaluate CYLINDER clip placement following MR guided
RIGHT breast biopsy.

EXAM:
DIAGNOSTIC RIGHT MAMMOGRAM POST MRI BIOPSY

[R ML synth-2D]
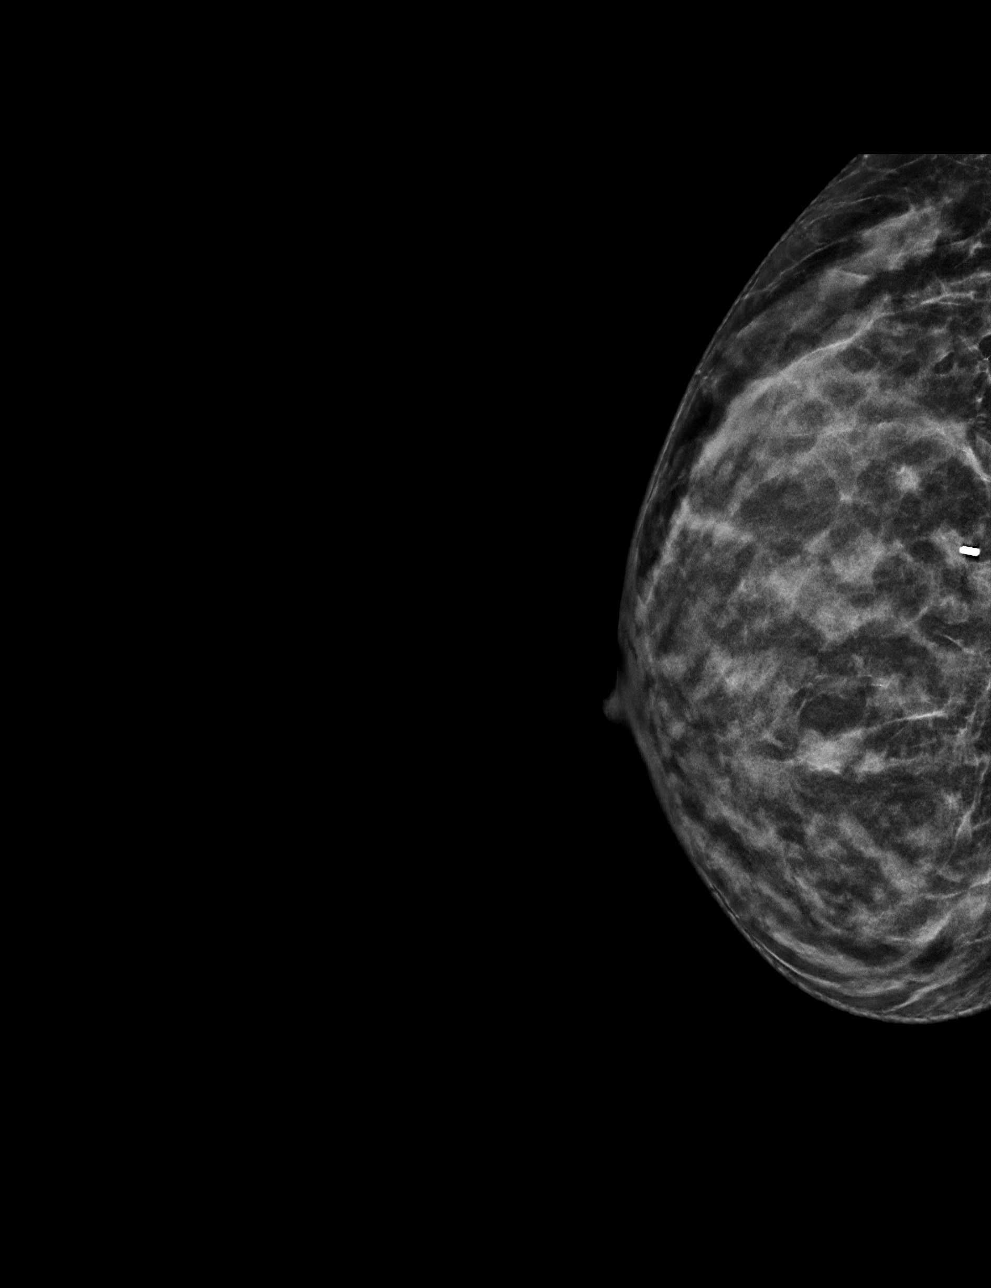

[R CC synth-2D]
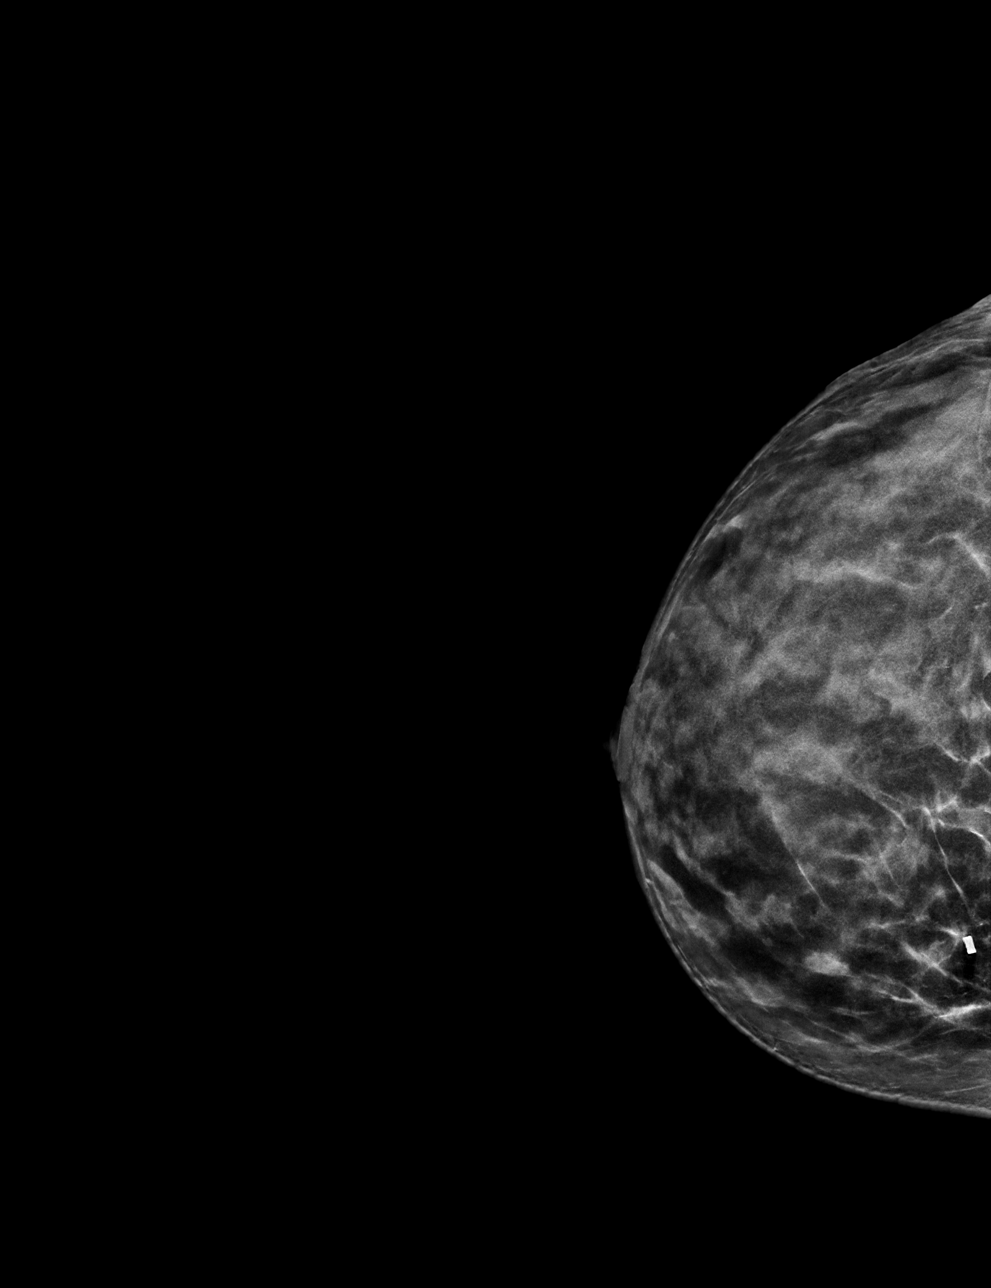

[R CC tomo · tomo slice 29/58.0]
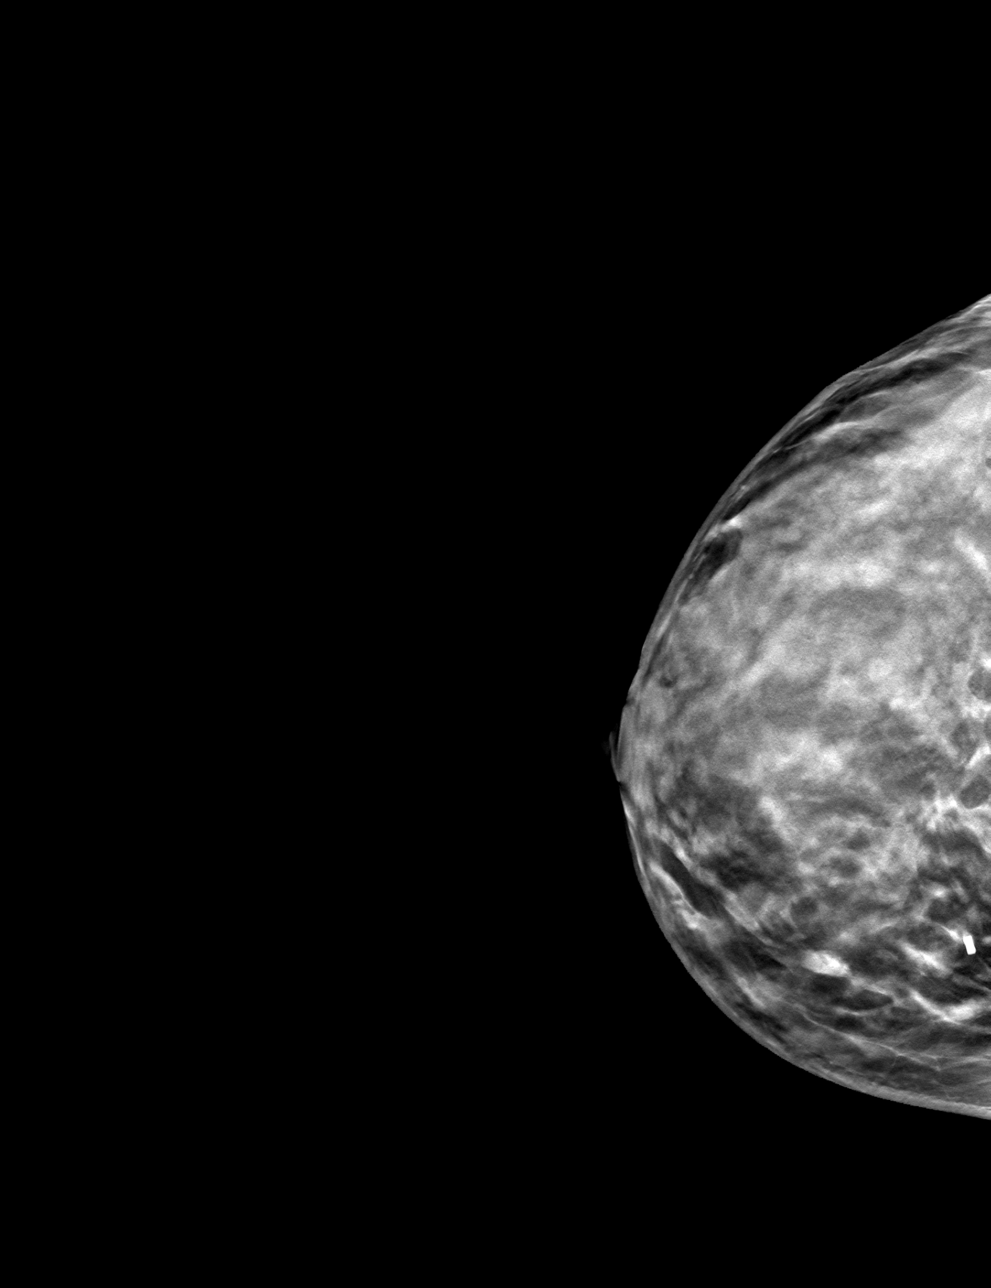

[R ML tomo · tomo slice 23/45.0]
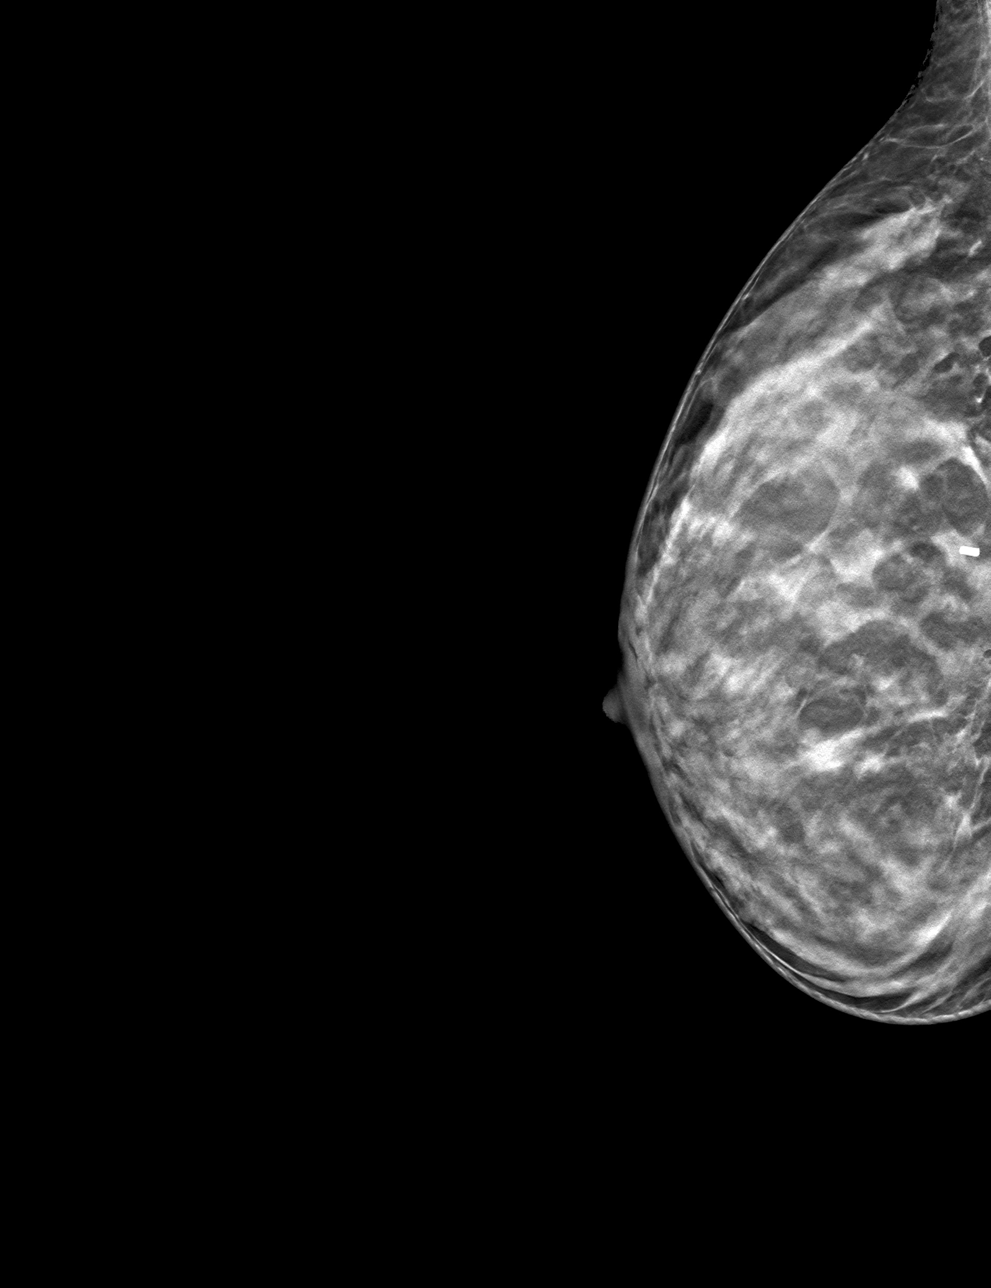

[4 of 12 positions shown; findings below may reference images not displayed]

FINDINGS: Mammographic images were obtained following MR guided biopsy of
posterior INNER RIGHT breast non masslike enhancement. The CYLINDER
biopsy marking clip is in expected position at the site of biopsy.
IMPRESSION: Appropriate positioning of the CYLINDER shaped biopsy marking clip
at the site of biopsy in the posterior INNER RIGHT breast.

Final Assessment: Post Procedure Mammograms for Marker Placement

## 2020-07-17 IMAGING — MR MR BREAST BX W/ LOC DEV 1ST LEASION IMAGE BX SPEC MR GUIDE*R*
9 of 12 series · 33 of 48 positions shown · IV contrast (5 ml Gadavist)
Comparison: Previous exams.
COMPARISON: Previous exams.

Addendum:
CLINICAL DATA: 29-year-old female presents for tissue sampling of
INNER RIGHT breast non masslike enhancement. On preprocedure imaging
today, only the more posterior aspect of the non masslike
enhancement identified on [DATE] MR is noted and only this area
was sampled.

EXAM:
MRI GUIDED CORE NEEDLE BIOPSY OF THE RIGHT BREAST
TECHNIQUE: Multiplanar, multisequence MR imaging of the RIGHT breast was
performed both before and after administration of intravenous
contrast.
CONTRAST:  5 cc intravenous Gadavist

[Series 2: fiducial unilateral · sagittal · 2.0mm · 1.33mm/px · 3 of 52 slices shown]
[im 1/52]
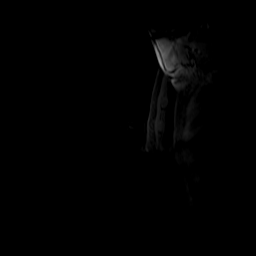
[im 26/52]
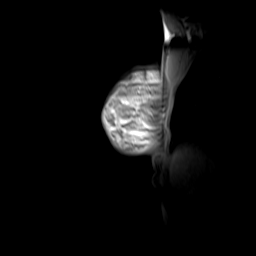
[im 52/52]
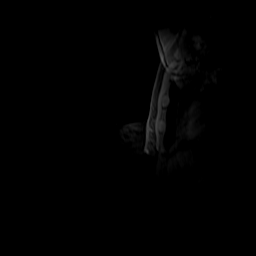

[Series 3: dynamic pre · axial · non-contrast · 1.3mm · 0.73mm/px · z∈[-64,+122]mm · 5 of 144 slices shown]
[im 1/144]
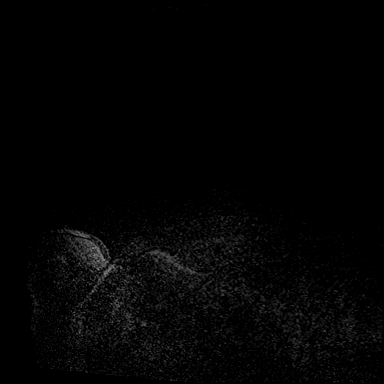
[im 36/144]
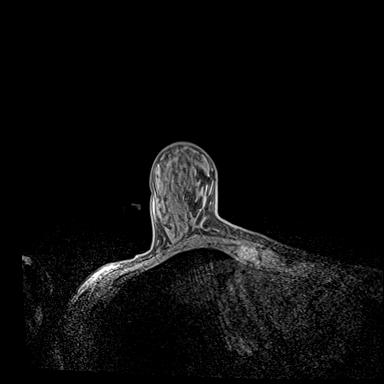
[im 72/144]
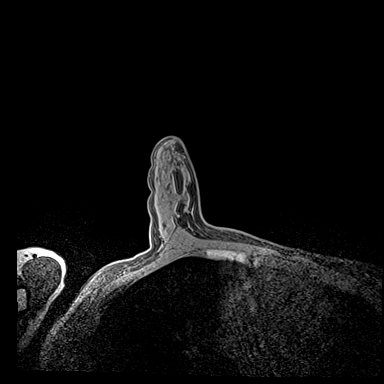
[im 108/144]
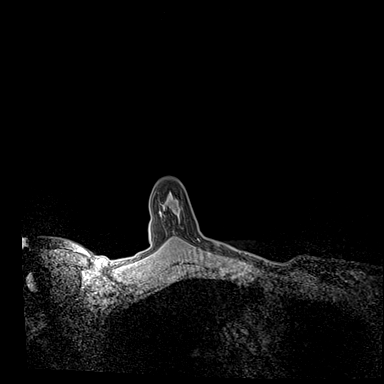
[im 144/144]
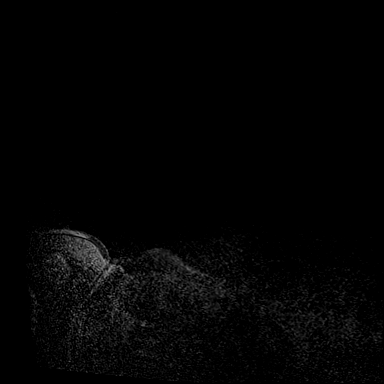

[Series 4: dynamic post 20 · axial · 1.3mm · 0.73mm/px · z∈[-64,+122]mm · 4 of 144 slices shown]
[im 1/144]
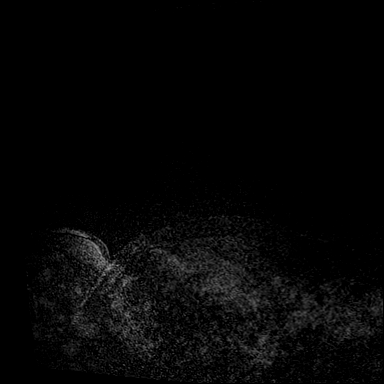
[im 48/144]
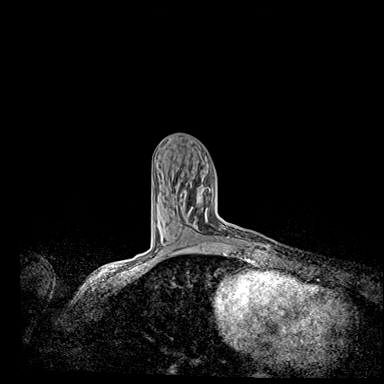
[im 96/144]
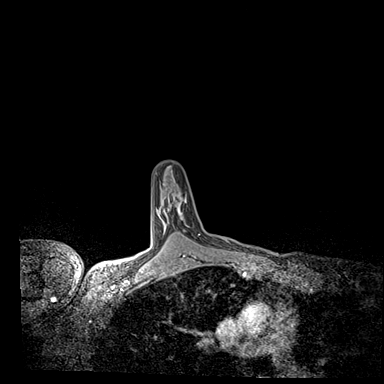
[im 144/144]
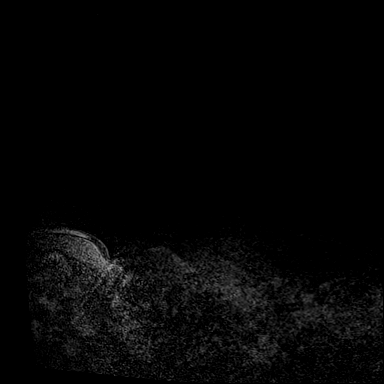

[Series 5: sub_s4-s3_1 20 sec · axial · 1.3mm · 0.73mm/px · z∈[-64,+122]mm · 4 of 144 slices shown]
[im 1/144]
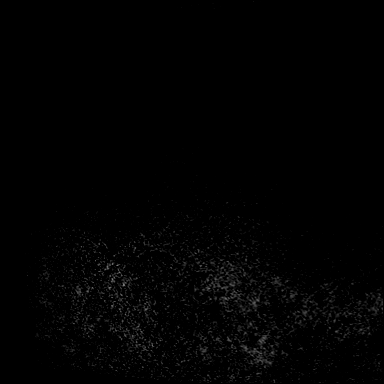
[im 48/144]
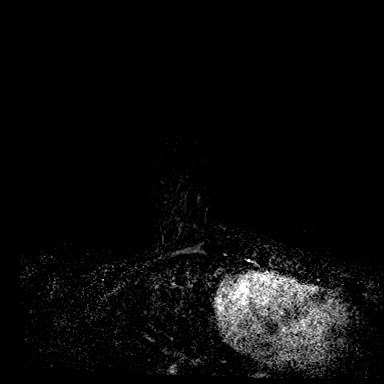
[im 96/144]
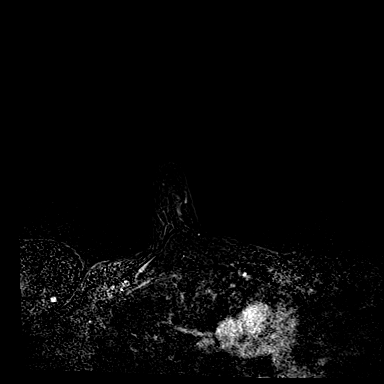
[im 144/144]
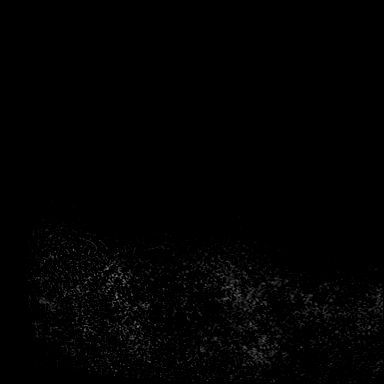

[Series 6: dynamic post 3 · axial · 1.3mm · 0.73mm/px · z∈[-64,+122]mm · 4 of 144 slices shown]
[im 1/144]
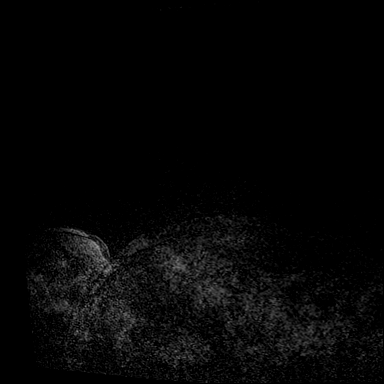
[im 48/144]
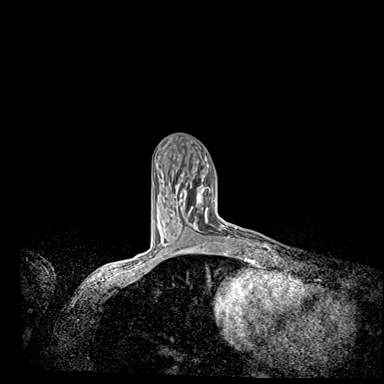
[im 96/144]
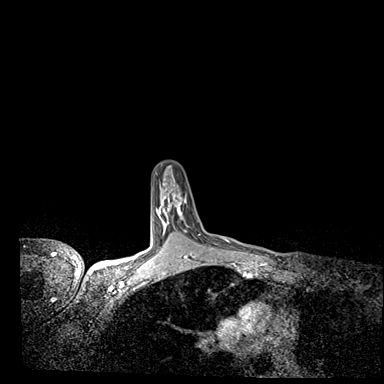
[im 144/144]
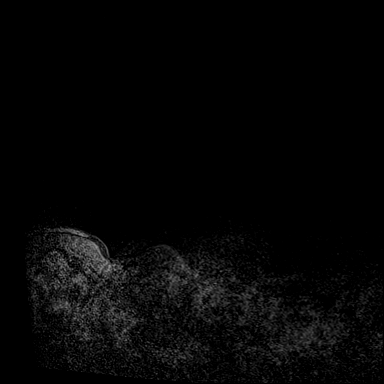

[Series 7: sub_s6-s3_1... 3 min · axial · 1.3mm · 0.73mm/px · z∈[-64,+122]mm · 4 of 144 slices shown]
[im 1/144]
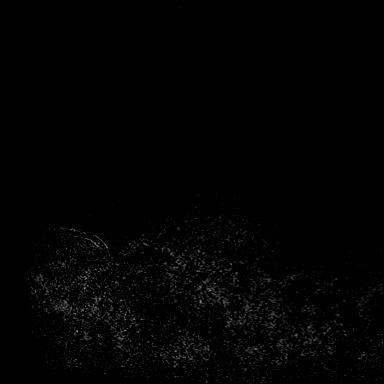
[im 48/144]
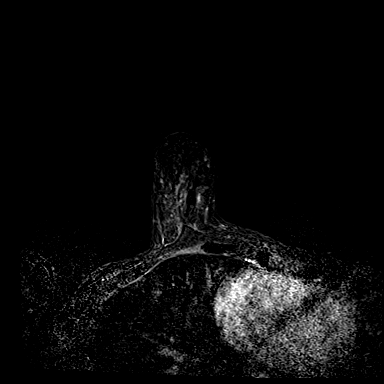
[im 96/144]
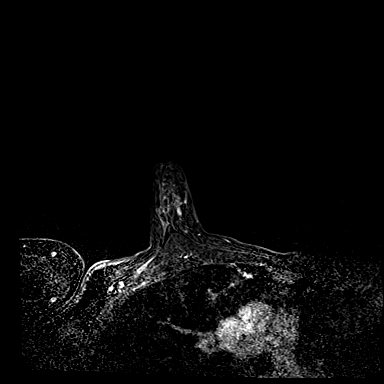
[im 144/144]
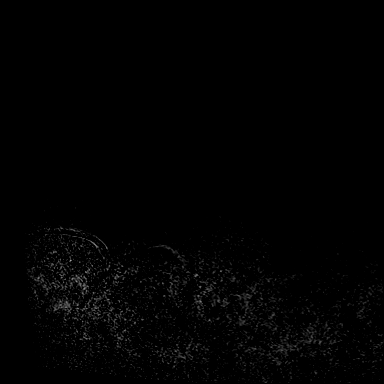

[Series 8: dynamic post 7 · axial · 1.3mm · 0.73mm/px · z∈[-64,+122]mm · 4 of 144 slices shown]
[im 1/144]
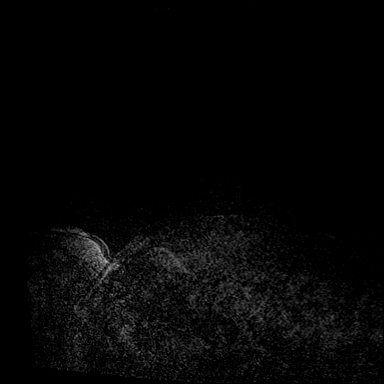
[im 48/144]
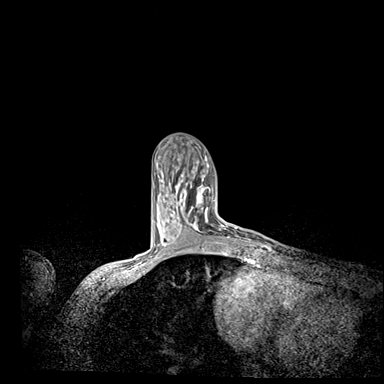
[im 96/144]
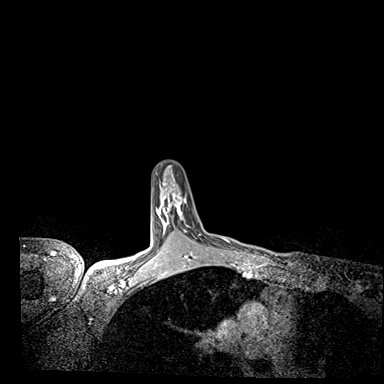
[im 144/144]
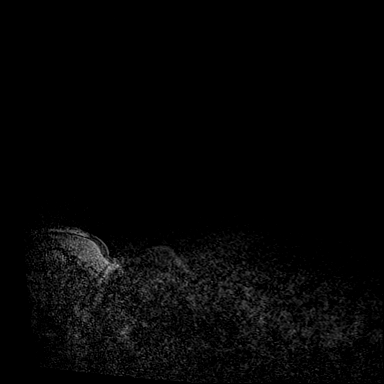

[Series 9: sub_s8-s3_1 ...7 min · axial · 1.3mm · 0.73mm/px · z∈[-64,+122]mm · 4 of 144 slices shown]
[im 1/144]
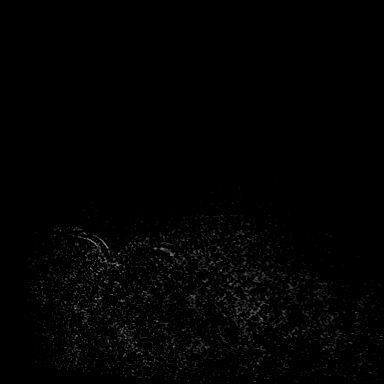
[im 48/144]
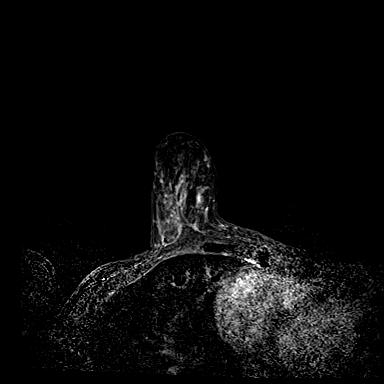
[im 96/144]
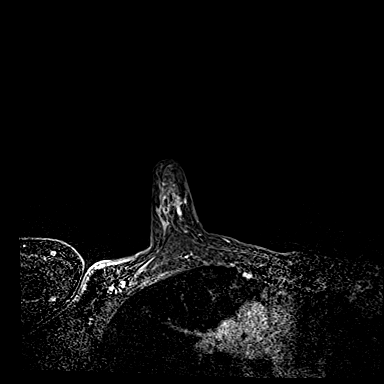
[im 144/144]
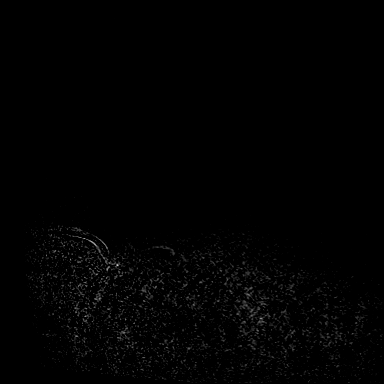

[Series 10: dynamic post 14 · axial · 1.3mm · 0.73mm/px · 1 of 144 slices shown]
[im 1/144]
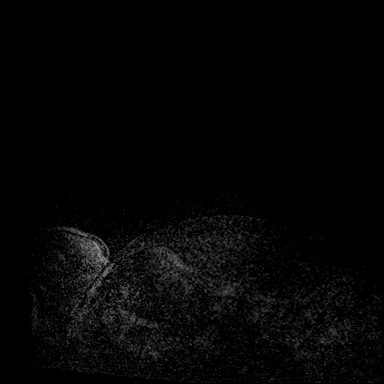

[33 of 48 positions shown; findings below may reference images not displayed]

FINDINGS: I met with the patient, and we discussed the procedure of MRI guided
biopsy, including risks, benefits, and alternatives. Specifically,
we discussed the risks of infection, bleeding, tissue injury, clip
migration, and inadequate sampling. Informed, written consent was
given. The usual time out protocol was performed immediately prior
to the procedure.

Using sterile technique, 1% Lidocaine, MRI guidance, and a 9 gauge
vacuum assisted device, biopsy was performed of the non masslike
enhancement within the far posterior slightly INNER RIGHT breast
using a LATERAL approach. At the conclusion of the procedure, a
CYLINDER tissue marker clip was deployed into the biopsy cavity.
Follow-up 2-view mammogram was performed and dictated separately.
IMPRESSION: MRI guided biopsy of posterior INNER RIGHT breast non masslike
enhancement. No apparent complications.

ADDENDUM:
Pathology revealed BENIGN BREAST TISSUE WITH FOCAL SCLEROSING
ADENOSIS, BENIGN SKELETAL MUSCLE of the RIGHT breast, posterior
inner. This was found to be concordant by Dr. GORIVA.

Pathology results were discussed with the patient by telephone. The
patient reported doing well after the biopsy with tenderness at the
site. Post biopsy instructions and care were reviewed and questions
were answered. The patient was encouraged to call The [REDACTED] for any additional concerns. My direct phone
number was provided.

The patient was instructed to return for a bilateral breast MRI in 6
months, per protocol.

Pathology results reported by GORIVA, RN on [DATE].

*** End of Addendum ***
FINDINGS: I met with the patient, and we discussed the procedure of MRI guided
biopsy, including risks, benefits, and alternatives. Specifically,
we discussed the risks of infection, bleeding, tissue injury, clip
migration, and inadequate sampling. Informed, written consent was
given. The usual time out protocol was performed immediately prior
to the procedure.

Using sterile technique, 1% Lidocaine, MRI guidance, and a 9 gauge
vacuum assisted device, biopsy was performed of the non masslike
enhancement within the far posterior slightly INNER RIGHT breast
using a LATERAL approach. At the conclusion of the procedure, a
CYLINDER tissue marker clip was deployed into the biopsy cavity.
Follow-up 2-view mammogram was performed and dictated separately.
IMPRESSION: MRI guided biopsy of posterior INNER RIGHT breast non masslike
enhancement. No apparent complications.

## 2020-07-17 MED ORDER — GADOBUTROL 1 MMOL/ML IV SOLN
5.0000 mL | Freq: Once | INTRAVENOUS | Status: AC | PRN
  Administered 2020-07-17: 5 mL via INTRAVENOUS

## 2020-12-22 ENCOUNTER — Other Ambulatory Visit: Payer: Self-pay | Admitting: Obstetrics

## 2020-12-22 DIAGNOSIS — Z1589 Genetic susceptibility to other disease: Secondary | ICD-10-CM

## 2021-01-29 ENCOUNTER — Other Ambulatory Visit: Payer: Self-pay

## 2021-01-29 ENCOUNTER — Ambulatory Visit
Admission: RE | Admit: 2021-01-29 | Discharge: 2021-01-29 | Disposition: A | Discharge status: Home / Self Care | Payer: BC Managed Care – PPO | Source: Ambulatory Visit | Attending: Obstetrics | Admitting: Obstetrics

## 2021-01-29 DIAGNOSIS — Z1589 Genetic susceptibility to other disease: Secondary | ICD-10-CM

## 2021-01-29 IMAGING — MR MR BREAST BILAT WO/W CM
8 of 12 series · 33 of 48 positions shown · IV contrast (5 ml gadavist)
Comparison: Previous exam(s).

CLINICAL DATA: 30-year-old BRCA positive female presenting for
six-month follow-up after a benign right breast MRI guided biopsy in
[DATE].

LABS:  None performed on site.
EXAM:
BILATERAL BREAST MRI WITH AND WITHOUT CONTRAST
TECHNIQUE: Multiplanar, multisequence MR images of both breasts were obtained
prior to and following the intravenous administration of 5 ml of
Gadavist.

[Series 2: t2_tirm_tra ipat (a-p) · axial · 3.0mm · 0.64mm/px · 1 of 55 slices shown]
[im 1/55]
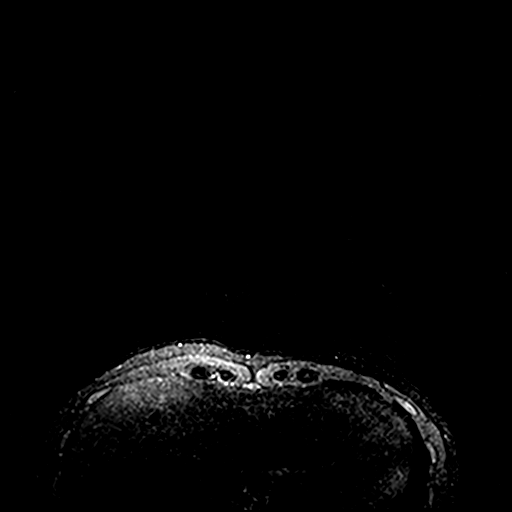

[Series 3: fl3d pre-cm no · axial · non-contrast · 1.2mm · 0.86mm/px · z∈[-80,+92]mm · 5 of 144 slices shown]
[im 1/144]
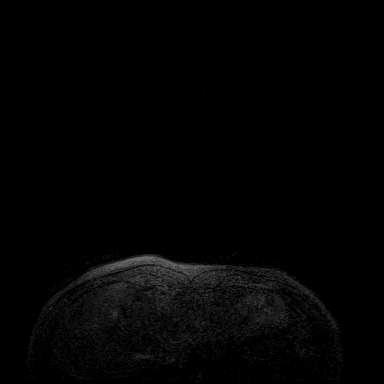
[im 36/144]
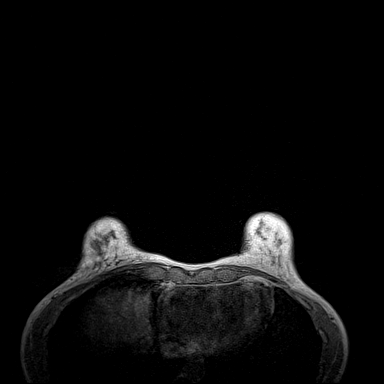
[im 72/144]
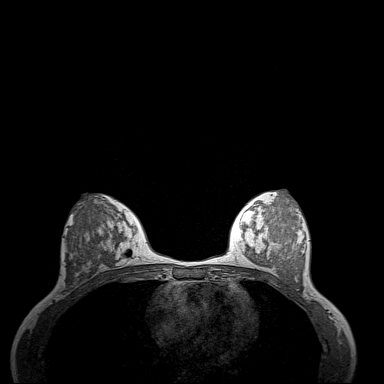
[im 108/144]
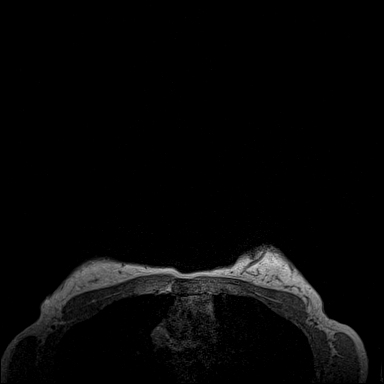
[im 144/144]
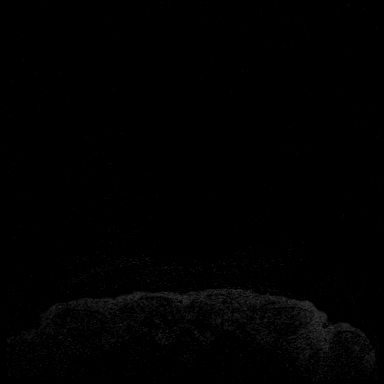

[Series 4: fl3d pre-cm · axial · non-contrast · 1.2mm · 0.86mm/px · z∈[-80,+92]mm · 5 of 144 slices shown]
[im 1/144]
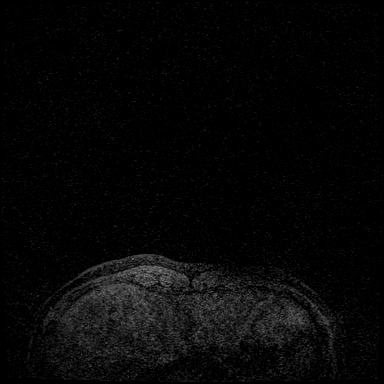
[im 36/144]
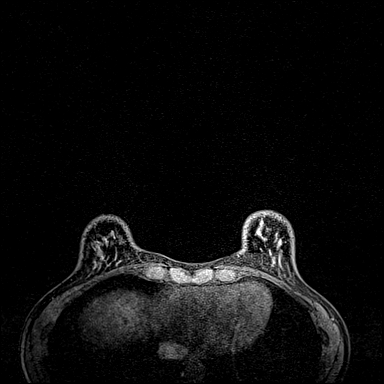
[im 72/144]
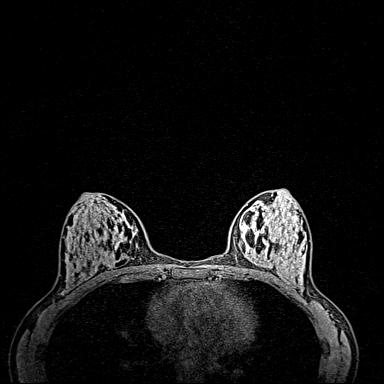
[im 108/144]
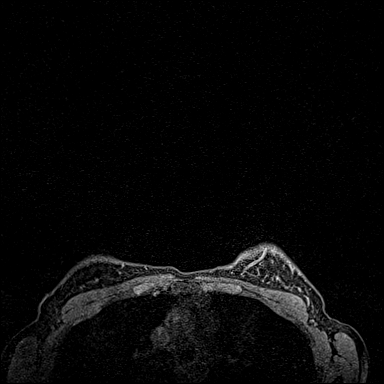
[im 144/144]
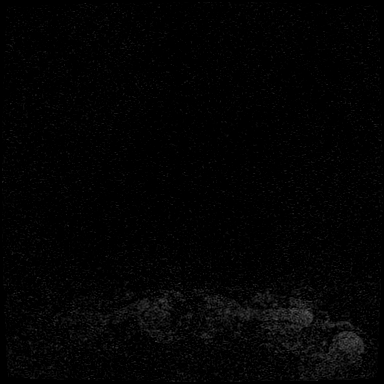

[Series 5: fl3d post immediate · axial · 1.2mm · 0.86mm/px · z∈[-80,+92]mm · 5 of 144 slices shown (1 of 3)]
[im 1/144]
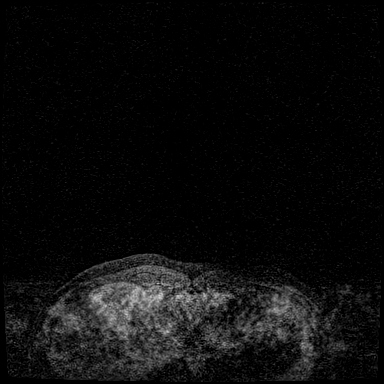
[im 36/144]
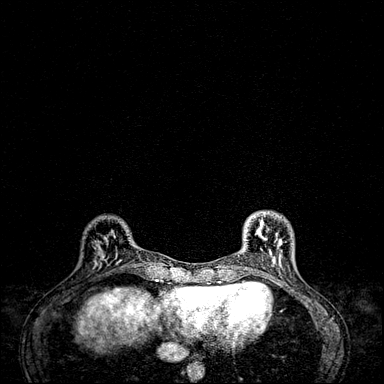
[im 72/144]
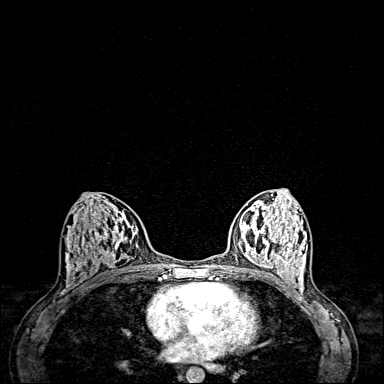
[im 108/144]
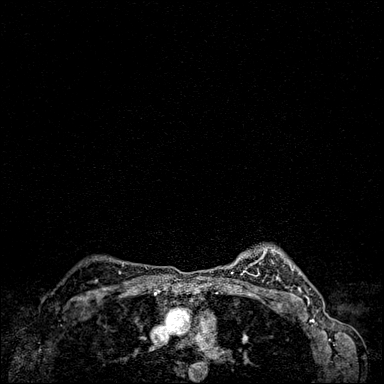
[im 144/144]
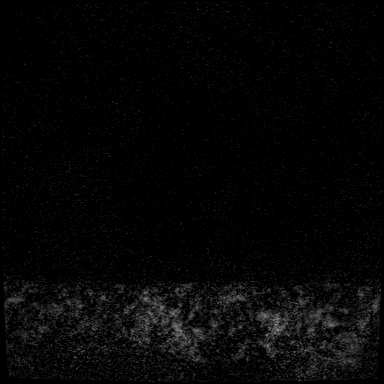

[Series 6: fl3d post immediate · axial · 1.2mm · 0.86mm/px · z∈[-80,+92]mm · 5 of 144 slices shown (2 of 3)]
[im 1/144]
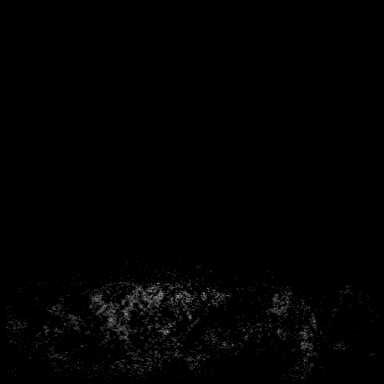
[im 36/144]
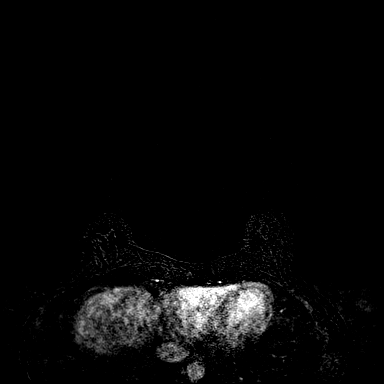
[im 72/144]
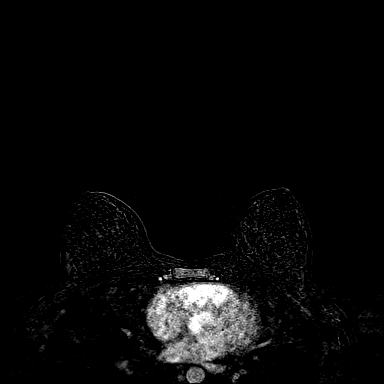
[im 108/144]
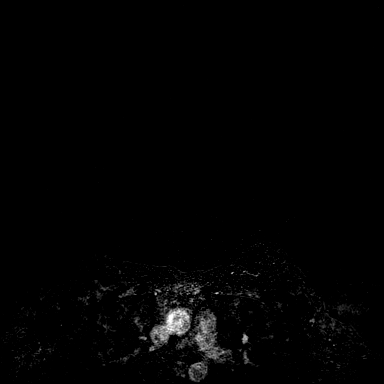
[im 144/144]
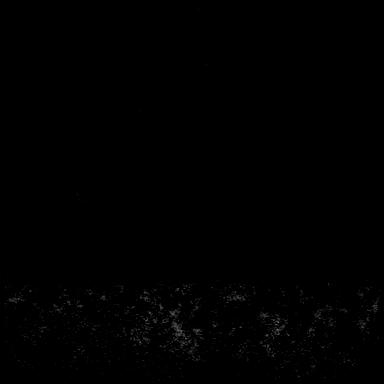

[Series 7: fl3d post immediate · axial · 172.8mm · 0.86mm/px · 1 of 1 slices shown (3 of 3)]
[im 1/1]
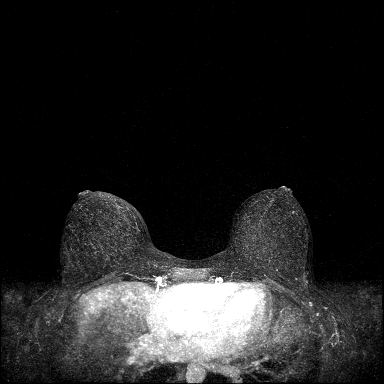

[Series 8: fl3d post 3min · axial · 1.2mm · 0.86mm/px · z∈[-80,+92]mm · 6 of 144 slices shown]
[im 1/144]
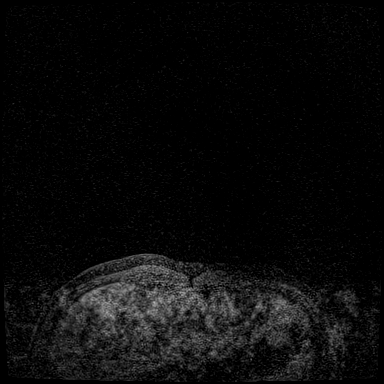
[im 29/144]
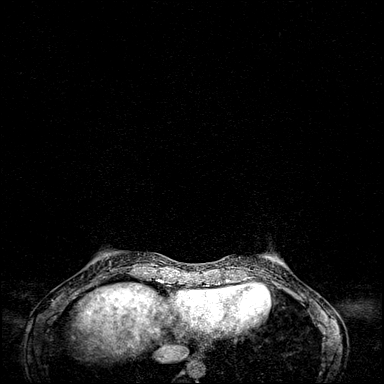
[im 58/144]
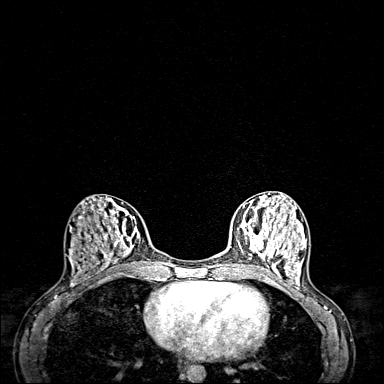
[im 86/144]
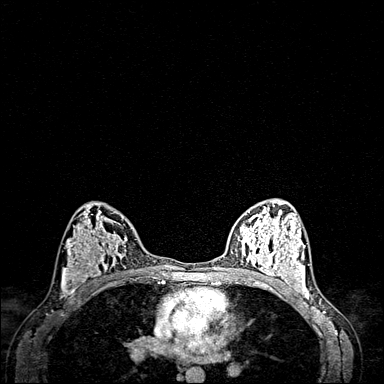
[im 115/144]
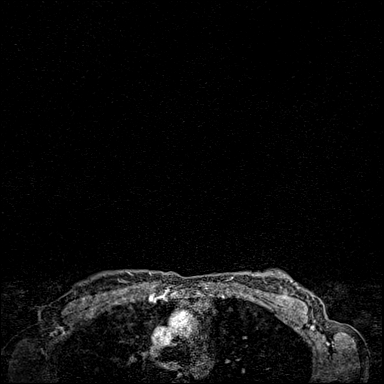
[im 144/144]
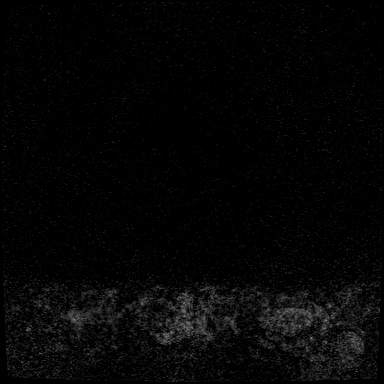

[Series 9: fl3d post 3min_sub · axial · 1.2mm · 0.86mm/px · z∈[-80,+57]mm · 5 of 144 slices shown]
[im 1/144]
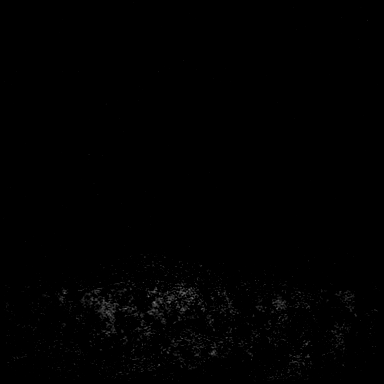
[im 29/144]
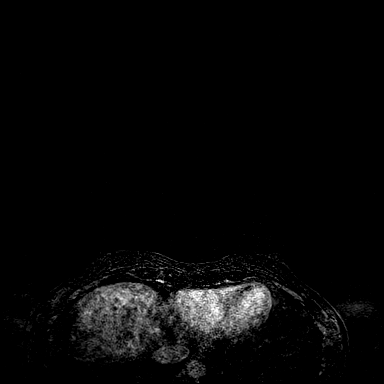
[im 58/144]
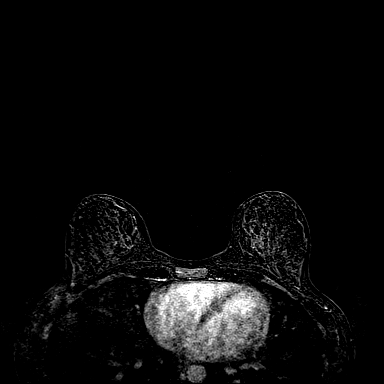
[im 86/144]
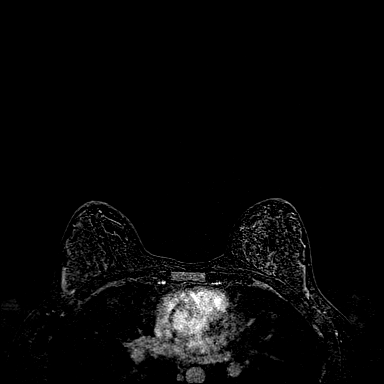
[im 115/144]
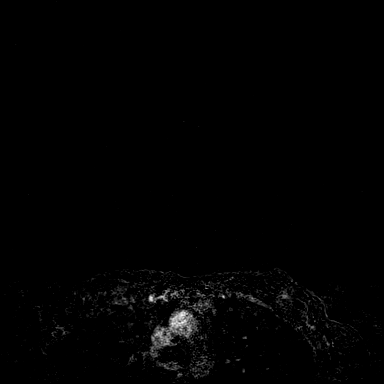

[33 of 48 positions shown; findings below may reference images not displayed]

Three-dimensional MR images were rendered by post-processing of the
original MR data on an independent workstation. The
three-dimensional MR images were interpreted, and findings are
reported in the following complete MRI report for this study. Three
dimensional images were evaluated at the independent interpreting
workstation using the [employer]CAD thin client.
FINDINGS: Breast composition: d. Extreme fibroglandular tissue.

Background parenchymal enhancement: Mild.

Right breast: Susceptibility artifact from post biopsy clip is noted
in the far medial aspect. No mass or abnormal enhancement.

Left breast: No mass or abnormal enhancement.

Lymph nodes: No abnormal appearing lymph nodes.

Ancillary findings:  None.
IMPRESSION: No MRI evidence of malignancy in either breast.

RECOMMENDATION:
Routine annual screening with mammography and breast MRI.

BI-RADS CATEGORY  1: Negative.

## 2021-01-29 MED ORDER — GADOBUTROL 1 MMOL/ML IV SOLN
5.0000 mL | Freq: Once | INTRAVENOUS | Status: AC | PRN
  Administered 2021-01-29: 5 mL via INTRAVENOUS

## 2022-02-13 ENCOUNTER — Other Ambulatory Visit: Payer: Self-pay | Admitting: Obstetrics

## 2022-02-13 DIAGNOSIS — Z1501 Genetic susceptibility to malignant neoplasm of breast: Secondary | ICD-10-CM

## 2022-02-27 ENCOUNTER — Ambulatory Visit
Admission: RE | Admit: 2022-02-27 | Discharge: 2022-02-27 | Disposition: A | Discharge status: Home / Self Care | Payer: BC Managed Care – PPO | Source: Ambulatory Visit | Attending: Obstetrics | Admitting: Obstetrics

## 2022-02-27 DIAGNOSIS — Z1501 Genetic susceptibility to malignant neoplasm of breast: Secondary | ICD-10-CM

## 2022-02-27 MED ORDER — GADOPICLENOL 0.5 MMOL/ML IV SOLN
6.0000 mL | Freq: Once | INTRAVENOUS | Status: AC | PRN
  Administered 2022-02-27: 6 mL via INTRAVENOUS

## 2023-02-27 ENCOUNTER — Encounter: Payer: Self-pay | Admitting: Obstetrics

## 2023-02-28 ENCOUNTER — Other Ambulatory Visit: Payer: Self-pay | Admitting: Obstetrics

## 2023-02-28 ENCOUNTER — Encounter: Payer: Self-pay | Admitting: Obstetrics

## 2023-02-28 DIAGNOSIS — Z1501 Genetic susceptibility to malignant neoplasm of breast: Secondary | ICD-10-CM

## 2023-03-19 ENCOUNTER — Other Ambulatory Visit: Payer: BC Managed Care – PPO

## 2023-05-02 ENCOUNTER — Ambulatory Visit
Admission: RE | Admit: 2023-05-02 | Discharge: 2023-05-02 | Disposition: A | Discharge status: Home / Self Care | Payer: BC Managed Care – PPO | Source: Ambulatory Visit | Attending: Obstetrics | Admitting: Obstetrics

## 2023-05-02 DIAGNOSIS — Z1501 Genetic susceptibility to malignant neoplasm of breast: Secondary | ICD-10-CM

## 2023-05-02 MED ORDER — GADOPICLENOL 0.5 MMOL/ML IV SOLN
5.0000 mL | Freq: Once | INTRAVENOUS | Status: AC | PRN
  Administered 2023-05-02: 5 mL via INTRAVENOUS

## 2023-05-05 ENCOUNTER — Other Ambulatory Visit: Payer: Self-pay | Admitting: Obstetrics

## 2023-05-05 ENCOUNTER — Encounter: Payer: Self-pay | Admitting: Obstetrics

## 2023-05-05 DIAGNOSIS — N63 Unspecified lump in unspecified breast: Secondary | ICD-10-CM

## 2023-05-12 ENCOUNTER — Other Ambulatory Visit: Payer: Self-pay | Admitting: Obstetrics

## 2023-05-12 DIAGNOSIS — N63 Unspecified lump in unspecified breast: Secondary | ICD-10-CM

## 2023-05-15 ENCOUNTER — Other Ambulatory Visit: Payer: BC Managed Care – PPO

## 2023-05-16 ENCOUNTER — Ambulatory Visit
Admission: RE | Admit: 2023-05-16 | Discharge: 2023-05-16 | Disposition: A | Discharge status: Home / Self Care | Payer: BC Managed Care – PPO | Source: Ambulatory Visit | Attending: Obstetrics | Admitting: Obstetrics

## 2023-05-16 DIAGNOSIS — N63 Unspecified lump in unspecified breast: Secondary | ICD-10-CM

## 2023-05-30 ENCOUNTER — Other Ambulatory Visit: Payer: Self-pay | Admitting: Obstetrics

## 2023-05-30 ENCOUNTER — Encounter: Payer: Self-pay | Admitting: Obstetrics

## 2023-05-30 DIAGNOSIS — N6310 Unspecified lump in the right breast, unspecified quadrant: Secondary | ICD-10-CM

## 2023-06-05 ENCOUNTER — Other Ambulatory Visit: Payer: Self-pay | Admitting: Obstetrics

## 2023-06-05 DIAGNOSIS — R928 Other abnormal and inconclusive findings on diagnostic imaging of breast: Secondary | ICD-10-CM

## 2023-06-09 ENCOUNTER — Ambulatory Visit
Admission: RE | Admit: 2023-06-09 | Discharge: 2023-06-09 | Disposition: A | Discharge status: Home / Self Care | Payer: BC Managed Care – PPO | Source: Ambulatory Visit | Attending: Obstetrics | Admitting: Obstetrics

## 2023-06-09 ENCOUNTER — Encounter: Payer: Self-pay | Admitting: Obstetrics

## 2023-06-09 DIAGNOSIS — Z1501 Genetic susceptibility to malignant neoplasm of breast: Secondary | ICD-10-CM

## 2023-06-09 DIAGNOSIS — R928 Other abnormal and inconclusive findings on diagnostic imaging of breast: Secondary | ICD-10-CM

## 2023-06-09 MED ORDER — GADOPICLENOL 0.5 MMOL/ML IV SOLN
5.0000 mL | Freq: Once | INTRAVENOUS | Status: AC | PRN
  Administered 2023-06-09: 5 mL via INTRAVENOUS

## 2023-06-10 LAB — SURGICAL PATHOLOGY

## 2023-10-20 ENCOUNTER — Other Ambulatory Visit: Payer: Self-pay | Admitting: Obstetrics

## 2023-10-20 DIAGNOSIS — Z1509 Genetic susceptibility to other malignant neoplasm: Secondary | ICD-10-CM

## 2023-11-28 ENCOUNTER — Ambulatory Visit
Admission: RE | Admit: 2023-11-28 | Discharge: 2023-11-28 | Disposition: A | Discharge status: Home / Self Care | Source: Ambulatory Visit | Attending: Obstetrics | Admitting: Obstetrics

## 2023-11-28 DIAGNOSIS — Z1509 Genetic susceptibility to other malignant neoplasm: Secondary | ICD-10-CM

## 2023-11-28 MED ORDER — GADOPICLENOL 0.5 MMOL/ML IV SOLN
5.5000 mL | Freq: Once | INTRAVENOUS | Status: AC | PRN
  Administered 2023-11-28: 5.5 mL via INTRAVENOUS
